# Patient Record
Sex: Male | Born: 1980 | Race: Black or African American | Hispanic: No | Marital: Single | Smoking: Current every day smoker
Health system: Southern US, Community
[De-identification: ages and names within clinical notes are randomized; demographics above are authoritative.]

## PROBLEM LIST (undated history)

## (undated) DIAGNOSIS — H579 Unspecified disorder of eye and adnexa: Secondary | ICD-10-CM

## (undated) HISTORY — PX: LEG AMPUTATION BELOW KNEE: SHX694

---

## 2005-07-18 ENCOUNTER — Inpatient Hospital Stay (HOSPITAL_COMMUNITY): Admission: AC | Admit: 2005-07-18 | Discharge: 2005-07-31 | Payer: Self-pay

## 2005-07-18 ENCOUNTER — Ambulatory Visit: Payer: Self-pay | Admitting: Internal Medicine

## 2005-07-27 ENCOUNTER — Ambulatory Visit: Payer: Self-pay | Admitting: Plastic Surgery

## 2005-09-02 ENCOUNTER — Ambulatory Visit: Payer: Self-pay | Admitting: Infectious Diseases

## 2005-09-02 ENCOUNTER — Inpatient Hospital Stay (HOSPITAL_COMMUNITY): Admission: AD | Admit: 2005-09-02 | Discharge: 2005-09-21 | Payer: Self-pay | Admitting: Specialist

## 2005-09-04 ENCOUNTER — Ambulatory Visit: Payer: Self-pay | Admitting: Plastic Surgery

## 2005-11-25 ENCOUNTER — Encounter: Admission: RE | Admit: 2005-11-25 | Discharge: 2005-11-25 | Payer: Self-pay | Admitting: Specialist

## 2006-03-29 ENCOUNTER — Emergency Department (HOSPITAL_COMMUNITY): Admission: EM | Admit: 2006-03-29 | Discharge: 2006-03-29 | Payer: Self-pay | Admitting: Emergency Medicine

## 2006-07-23 ENCOUNTER — Emergency Department (HOSPITAL_COMMUNITY): Admission: EM | Admit: 2006-07-23 | Discharge: 2006-07-23 | Payer: Self-pay | Admitting: Emergency Medicine

## 2006-08-28 ENCOUNTER — Ambulatory Visit: Payer: Self-pay | Admitting: Infectious Diseases

## 2006-08-28 ENCOUNTER — Inpatient Hospital Stay (HOSPITAL_COMMUNITY): Admission: RE | Admit: 2006-08-28 | Discharge: 2006-09-24 | Payer: Self-pay | Admitting: Specialist

## 2006-09-30 ENCOUNTER — Emergency Department (HOSPITAL_COMMUNITY): Admission: EM | Admit: 2006-09-30 | Discharge: 2006-09-30 | Payer: Self-pay | Admitting: Emergency Medicine

## 2007-05-25 ENCOUNTER — Emergency Department (HOSPITAL_COMMUNITY): Admission: EM | Admit: 2007-05-25 | Discharge: 2007-05-25 | Payer: Self-pay | Admitting: Emergency Medicine

## 2007-06-08 ENCOUNTER — Emergency Department (HOSPITAL_COMMUNITY): Admission: EM | Admit: 2007-06-08 | Discharge: 2007-06-08 | Payer: Self-pay | Admitting: Emergency Medicine

## 2007-08-28 ENCOUNTER — Emergency Department (HOSPITAL_COMMUNITY): Admission: EM | Admit: 2007-08-28 | Discharge: 2007-08-28 | Payer: Self-pay | Admitting: Emergency Medicine

## 2007-11-02 ENCOUNTER — Emergency Department (HOSPITAL_COMMUNITY): Admission: EM | Admit: 2007-11-02 | Discharge: 2007-11-02 | Payer: Self-pay | Admitting: Emergency Medicine

## 2008-01-05 ENCOUNTER — Emergency Department (HOSPITAL_COMMUNITY): Admission: EM | Admit: 2008-01-05 | Discharge: 2008-01-05 | Payer: Self-pay | Admitting: Emergency Medicine

## 2008-01-14 ENCOUNTER — Emergency Department (HOSPITAL_COMMUNITY): Admission: EM | Admit: 2008-01-14 | Discharge: 2008-01-14 | Payer: Self-pay | Admitting: Emergency Medicine

## 2008-03-04 ENCOUNTER — Emergency Department (HOSPITAL_COMMUNITY): Admission: EM | Admit: 2008-03-04 | Discharge: 2008-03-05 | Payer: Self-pay | Admitting: Emergency Medicine

## 2009-04-11 ENCOUNTER — Emergency Department (HOSPITAL_COMMUNITY): Admission: EM | Admit: 2009-04-11 | Discharge: 2009-04-11 | Payer: Self-pay | Admitting: Emergency Medicine

## 2009-05-21 ENCOUNTER — Emergency Department (HOSPITAL_COMMUNITY): Admission: EM | Admit: 2009-05-21 | Discharge: 2009-05-21 | Payer: Self-pay | Admitting: Emergency Medicine

## 2009-06-05 ENCOUNTER — Emergency Department (HOSPITAL_COMMUNITY): Admission: EM | Admit: 2009-06-05 | Discharge: 2009-06-05 | Payer: Self-pay | Admitting: Emergency Medicine

## 2009-06-11 ENCOUNTER — Emergency Department (HOSPITAL_COMMUNITY): Admission: EM | Admit: 2009-06-11 | Discharge: 2009-06-12 | Payer: Self-pay | Admitting: Emergency Medicine

## 2009-06-30 ENCOUNTER — Emergency Department (HOSPITAL_COMMUNITY): Admission: EM | Admit: 2009-06-30 | Discharge: 2009-06-30 | Payer: Self-pay | Admitting: Emergency Medicine

## 2010-11-16 ENCOUNTER — Encounter: Payer: Self-pay | Admitting: Specialist

## 2011-01-30 LAB — DIFFERENTIAL
Lymphocytes Relative: 10 % — ABNORMAL LOW (ref 12–46)
Lymphs Abs: 0.9 10*3/uL (ref 0.7–4.0)
Monocytes Relative: 6 % (ref 3–12)
Neutro Abs: 7.8 10*3/uL — ABNORMAL HIGH (ref 1.7–7.7)
Neutrophils Relative %: 82 % — ABNORMAL HIGH (ref 43–77)

## 2011-01-30 LAB — CBC
Platelets: 314 10*3/uL (ref 150–400)
RBC: 4.28 MIL/uL (ref 4.22–5.81)
WBC: 9.5 10*3/uL (ref 4.0–10.5)

## 2011-01-30 LAB — BASIC METABOLIC PANEL
BUN: 7 mg/dL (ref 6–23)
Creatinine, Ser: 0.87 mg/dL (ref 0.4–1.5)
GFR calc Af Amer: >60 mL/min (ref >60)
GFR calc non Af Amer: >60 mL/min (ref >60)

## 2011-01-30 LAB — CULTURE, ROUTINE-ABSCESS
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
## 2011-03-13 NOTE — Op Note (Signed)
NAME:  JERONIMO, HELLBERG NO.:  [id number]   MEDICAL RECORD NO.:  [id number]          PATIENT TYPE:  INP   LOCATION:  5738                         FACILITY:  [facility]   PHYSICIAN:  Kerrin Champagne, M.D.   DATE OF BIRTH:  1981/09/13   DATE OF PROCEDURE:  07/18/2005  DATE OF DISCHARGE:                                 OPERATIVE REPORT   ADDENDUM:  Please note that this patient at the end of the case prior to  placement of dressings did undergo measurement of compartment pressures. His  diastolic pressure at the time of compartment pressure measurements using  Stryker compartment measure was diastolic pressure of 66, measuring the  superficial posterior compartment measuring it about 17 to 20 mm.  His deep  posterior compartment at 23 mm, his anterior compartment at 32 mm.  His  lateral compartment at 33 mm.  These appeared to be well within acceptable  limits.  Prior to the surgery and placement of IM nails, his compartments  were completely supple.  In fact they did appear to have probably  decompressed with the fracture; however, after undergoing intramedullary  nail, there was some minimal swelling present.  However, the compartment  still remained quite supple.  Therefore compartment pressures were obtained  and it was felt that compartment release was not necessary based on these  compartment readings.  The patient will be observed postoperatively if he  develops further increasing pain discomfort unrelieved by medicines or  otherwise will re-examine his compartments to evaluate if there is any  worsening here.      Kerrin Champagne, M.D.  Electronically Signed     JEN/MEDQ  D:  07/18/2005  T:  07/20/2005  Job:  [id number]
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
## 2011-03-13 NOTE — Op Note (Signed)
[name] [name]              ACCOUNT NO.:  [id number]   MEDICAL RECORD NO.:  [id number]          PATIENT TYPE:  OUT   LOCATION:  RAD                           FACILITY:  [facility]   PHYSICIAN:  Kerrin Champagne, M.D.   DATE OF BIRTH:  11/26/1980   DATE OF PROCEDURE:  09/01/2006  DATE OF DISCHARGE:                               OPERATIVE REPORT   PREOPERATIVE DIAGNOSIS:  Infected, nonunion with osteomyelitis, left mid-  shaft tibia-fibular fracture.   POSTOPERATIVE DIAGNOSIS:  Infected, nonunion with osteomyelitis, left  mid-shaft tibia-fibular fracture.   PROCEDURE:  1. Repeat irrigation and debridement of left infected nonunion, mid-      shaft tibia fracture.  2. Removal of antibiotic tobramycin methylmethacrylate beads.  3. Reapplication of tobramycin methylmethacrylate beads.  4. Reapplication of Vac.   SURGEON:  Kerrin Champagne, M.D.   ASSISTANT:  None.   ANESTHESIA:  General via orotracheal intubation, Dr. Diamantina Monks.   SPECIMENS:  None.   ESTIMATED BLOOD LOSS:  50 mL.   COMPLICATIONS:  None.   TOTAL TOURNIQUET TIME:  At 350 mmHg, 53 minutes.   DISPOSITION:  The patient returned to the PACU in good condition.   HISTORY OF PRESENT ILLNESS:  This patient's history is well documented  within his chart.  Briefly, a 30 year old male sustained an open left  grade 3 tibia-fibular fracture treated initially with irrigation and  debridement, IM nailing. Closure was tight, requiring a Vac.  Patient  eventually went on to close the wound and then it reopened after several  weeks.  Returned to the operating room, had removal of nail, application  of external fixator, debridement of the area of open osteomyelitis.  With the application of fixator and use of Vac again, attempts were made  at trying to get this to heal.  The patient continued to have a draining  area over the anteromedial portion of the mid-shaft tibia.  Eventually  the external fixator was removed.  The  patient has developed a varus  deformity, radiographic signs of hypertrophic callus at the fracture  site and lucency.  Findings are consistent with an infected nonunion,  left mid-shaft tibia fracture.  He is brought back to the operating room  to undergo a repeat irrigation and debridement after undergoing an  initial irrigation and debridement on August 28, 2006.   FINDINGS INTRAOPERATIVELY:  The patient's antibiotic beads were removed  and areas of hematoma within the intramedullary canal, some small areas  requiring debridement of soft tissue as well as necrotic bone along the  posterior wall of the intramedullary cavity, as well as medially.  Otherwise several good areas of bleeding bone surface was noted.  No  wide areas of necrotic bone noted.   DESCRIPTION OF PROCEDURE:  After adequate general anesthesia, the  patient's left lower extremity was prepped with Betadine scrub and prep  solution following removal of previous Vac.  A tourniquet about the left  upper thigh.  Patient was draped in the usual manner.  The left lower  extremity was elevated and exsanguinated with elevation alone.  Tourniquet inflated to 350  mmHg.  The old incision was opened removing  Prolene sutures both distal and proximal.  The area was superficially  curetted and a Cobb elevator used to elevate more of the subcu layers  medially open.   Antibiotic beads were then removed from the intramedullary canal through  a defect over the anteromedial aspect of the mid-shaft tibia.  After  removal of these, then curet was used to debride old hematoma from  within the intramedullary canal.  There was no sign of infection or  purulence.  The entire channel within the intramedullary canal proximal  and distal was debrided using curettage.  Irrigation then with normal  saline solution.  A high speed bur was then used to bur the entire  circumference of the intramedullary canal, proximal to distal, down to   bleeding bone surfaces.  Irrigation was used during this process to  prevent any heat production.  The posterior wall of the infected  nonunion cavity showed an area of persistent non bleeding bone and this  was debrided using curet and bare rongeur in the posterior compartment  area.  This was carefully curetted to ensure there was no further  infected debris present along the medial wall of the fracture nonunion  site.  With this completed then, irrigation was further performed.  Using a pulse irrigant solution, nearly 3 L were passed through and then  double antibiotic solution was passed through the open wound site.   Following this then antibiotic beads were then made using a mixture of 1  g tobramycin to 1 packet of HV rapid forming polymethylmethacrylate  cement.  A 22-gauge wire carefully wrapped into small loops and small  balls of cement of polymethylmethacrylate combined with tobramycin were  then rounded over each of these loops in order to attach them to the  wires, six in total on one wire, two and three on the adjacent.  This  was then placed within the intramedullary chamber both proximal and  distal and then more superficial.  This completed then, a 2-0 Prolene  suture was used to approximate skin edges both proximal and distal with  a vertical mattress suture.  A Vac was then applied by placing small  portions of sponge within the central cavity that remained and then  overlying this area.  A small sponge was applied and this was affixed to  the skin using the Tegaderm-light material.  Suction portal was then  attached and attached to the Vac machine at 125 mmHg vacuum.   With this completed then, tourniquet was then released and the left  lower extremity returned to posterior splint and a 6-inch ace wrap used  to hold it in place.  Patient was then reactivated, extubated, returned  to the recovery room in satisfactory condition.  All instrument and sponge counts were  correct.      Kerrin Champagne, M.D.  Electronically Signed     JEN/MEDQ  D:  09/01/2006  T:  09/02/2006  Job:  [id number]
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
## 2011-03-13 NOTE — Discharge Summary (Signed)
NAMEJAYMASON, LEDESMA              ACCOUNT NO.:  [id number]   MEDICAL RECORD NO.:  [id number]          PATIENT TYPE:  INP   LOCATION:  5007                         FACILITY:  [facility]   PHYSICIAN:  Kerrin Champagne, M.D.   DATE OF BIRTH:  1981/08/15   DATE OF ADMISSION:  08/28/2006  DATE OF DISCHARGE:  09/24/2006                               DISCHARGE SUMMARY   ADDENDUM TO DISCHARGE SUMMARY   ADDITIONAL PROCEDURES:  Performed during this hospitalization September 22, 2006, repeat irrigation and debridement of left mid shaft tibial  infected nonunion with replacement of tobramycin beads and replacement  of vac.   CONSULTATIONS:  Obtained during this hospitalization:  1. Infectious disease, Dr. Burnice Logan.  2. Pharmacy consult regarding vancomycin treatment.  3. Radiology consult replacement of vac.   ADDENDUM TO HOSPITAL COURSE:  Mr. Arshdeep Bolger. Dibiasio continued with the  use of a vac. His vac was changed on September 20, 2006 on ward 5000 by  the wound care team. He was returned to the operating room on September 22, 2006 and underwent repeat irrigation and debridement of left  infected tibial nonunion with replacement of polymethylmethacrylate  beads and replacement of the vac. The patient remained on IV antibiotics  of vancomycin and ceftazidime, 1 g of each IV q.8h. This was given via  his PICC line. On September 23, 2006, the patient informed us that  through his family he was notified of his mother's recent passing as of  morning of September 23, 2006. This being the case, the patient was given  a short pass the evening September 23, 2006 in order to facilitate family  needs. He returned to the hospital the evening of September 23, 2006, and  on September 24, 2006, arrangements were made for him to continue with  the use of a vac on an outpatient basis until his presentation to [facility] on Sunday, September 26, 2006 for surgery  scheduled for Monday on September 27, 2006 with Dr. Lance Morin of  orthopedics/plastic surgery at [facility]. The  patient had prescriptions performed for vancomycin 1 g IV q.8h. and  ceftazidime 1 g IV q.8h. to continue through his PICC line through home  health nursing agencies in [place]. The patient will find his  way to [facility] with transportation from his home  on September 26, 2006. He was given a note regarding admission to Dr.  Edwin Cap service at [facility] with the  orthopedic/plastic surgery service. Pre-arranged surgery scheduled for  Monday morning. Mr. Baranowski does have his Medicaid card as of September 24, 2006. The patient will carry with him this discharge summary in  addition to update CT scans and radiographs since the time of his last  CT scan. If there are any questions regarding this patient, please  contact me. You can reach me through my pager at [phone] or through my  office at [phone].   DISCHARGE MEDICATIONS:  As above. The patient was given Lovenox  injectable syringes 40 mg to inject subcutaneously  q.a.m. at 8 a.m. for  Saturday and Sunday.   The status of the patient at the time of discharge is stable. Awaiting  further surgery at [facility].      Kerrin Champagne, M.D.  Electronically Signed     JEN/MEDQ  D:  09/24/2006  T:  09/24/2006  Job:  [id number]
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
## 2011-03-13 NOTE — Discharge Summary (Signed)
NAMEBRODRIC, SCHAUER NO.:  [id number]   MEDICAL RECORD NO.:  [id number]          PATIENT TYPE:  INP   LOCATION:  5007                         FACILITY:  [facility]   PHYSICIAN:  Kerrin Champagne, M.D.   DATE OF BIRTH:  1981-02-05   DATE OF ADMISSION:  08/28/2006  DATE OF DISCHARGE:  09/24/2006                               DISCHARGE SUMMARY   SECOND ADDENDUM:  His discharge summary just recently dictated an  addendum this morning.  [place] [id number] from early this  morning.   DISCHARGE PLANS:  The patient indicated that it has fallen to his  shoulders to help put together arrangements for his mother's funeral  within the next week, his mother having passed away yesterday morning,  September 23, 2006.  He notes that an older sister is overcome by the  grief of finding his mother passed away.  She requires assistance with  this.  His older brother is incarcerated and unable to assist.  This  being the case, we will discharge Mr. Hanley Hays home.  I called  [facility] and spoke with Rachael Darby in regard to canceling  Marcel's case scheduled for Monday, September 27, 2006, as an add on case.  Instead, [facility] and Dr. Edwin Cap office  will contact Hanley Hays at his home Monday morning on September 27, 2006, to make arrangements to see [facility] as an outpatient in their  facility and then make arrangements for further care in the form of flap  to his left leg.  In the interval, the patient will be discharged home  and be maintained on a VAC to the left leg to be changed 3 times weekly  by home health nursing.  He has indwelling antibiotic beads, #25, in the  left leg.  These will remain in place until he has final application of  flap for further procedures to his left leg.  He will continue in IV  antibiotics through home health nursing in the form of vancomycin 1 g IV  q.8h.  In addition to this, ceftazidime Elita Quick) 1  g IV q.8h.  For anti-  DVT prophylaxis, Lovenox 40 mg subcu q.a.m. at 8 a.m. he will receive  through their pharmacy at home health.  Further follow up with  vancomycin in regard to trough and peak levels for measurement during  his treatment will require at least 14 days of further IV treatment of  antibiotics with vancomycin and ceftazidime.  CBC and CMET should be  performed weekly.  Hopefully, this patient will complete his antibiotics  about the time he is scheduled to undergo vascularized flap at [facility].  This will be arranged with [facility] and the  patient.   The patient's status at the time of discharge is stable and improved.  The patient awaiting outpatient evaluation at [facility] for a  prevascularize of muscle flap to the left open to the fracture site.      Kerrin Champagne, M.D.  Electronically Signed     JEN/MEDQ  D:  09/24/2006  T:  09/24/2006  Job:  [id number]
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
## 2011-05-28 ENCOUNTER — Emergency Department (HOSPITAL_COMMUNITY)
Admission: EM | Admit: 2011-05-28 | Discharge: 2011-05-28 | Disposition: A | Discharge status: Home / Self Care | Payer: Self-pay | Attending: Emergency Medicine | Admitting: Emergency Medicine

## 2011-05-28 ENCOUNTER — Encounter: Payer: Self-pay | Admitting: *Deleted

## 2011-05-28 ENCOUNTER — Emergency Department (HOSPITAL_COMMUNITY): Payer: Self-pay

## 2011-05-28 DIAGNOSIS — F17200 Nicotine dependence, unspecified, uncomplicated: Secondary | ICD-10-CM | POA: Insufficient documentation

## 2011-05-28 DIAGNOSIS — S01501A Unspecified open wound of lip, initial encounter: Secondary | ICD-10-CM | POA: Insufficient documentation

## 2011-05-28 DIAGNOSIS — S0990XA Unspecified injury of head, initial encounter: Secondary | ICD-10-CM

## 2011-05-28 DIAGNOSIS — R55 Syncope and collapse: Secondary | ICD-10-CM | POA: Insufficient documentation

## 2011-05-28 DIAGNOSIS — S01511A Laceration without foreign body of lip, initial encounter: Secondary | ICD-10-CM

## 2011-05-28 DIAGNOSIS — S88119A Complete traumatic amputation at level between knee and ankle, unspecified lower leg, initial encounter: Secondary | ICD-10-CM | POA: Insufficient documentation

## 2011-05-28 MED ORDER — OXYCODONE-ACETAMINOPHEN 5-325 MG PO TABS
ORAL_TABLET | ORAL | Status: AC
  Administered 2011-05-28: 1
  Filled 2011-05-28: qty 1

## 2011-05-28 MED ORDER — LIDOCAINE HCL (PF) 1 % IJ SOLN
INTRAMUSCULAR | Status: AC
  Administered 2011-05-28: 5 mL
  Filled 2011-05-28: qty 5

## 2011-05-28 MED ORDER — CEPHALEXIN 500 MG PO CAPS: 500.0000 mg | ORAL_CAPSULE | Freq: Two times a day (BID) | ORAL | Status: AC
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
## 2011-05-28 NOTE — Discharge Instructions (Signed)
Clean laceration 2 times a day with soap and water.  Return as needed
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
## 2011-08-10 LAB — WOUND CULTURE
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
## 2012-08-29 ENCOUNTER — Encounter (HOSPITAL_COMMUNITY): Payer: Self-pay | Admitting: Emergency Medicine

## 2012-08-29 ENCOUNTER — Emergency Department (HOSPITAL_COMMUNITY)
Admission: EM | Admit: 2012-08-29 | Discharge: 2012-08-29 | Disposition: A | Discharge status: Home / Self Care | Payer: Medicaid Other | Attending: Emergency Medicine | Admitting: Emergency Medicine

## 2012-08-29 DIAGNOSIS — R5381 Other malaise: Secondary | ICD-10-CM | POA: Insufficient documentation

## 2012-08-29 DIAGNOSIS — S88119A Complete traumatic amputation at level between knee and ankle, unspecified lower leg, initial encounter: Secondary | ICD-10-CM | POA: Insufficient documentation

## 2012-08-29 DIAGNOSIS — R5383 Other fatigue: Secondary | ICD-10-CM

## 2012-08-29 DIAGNOSIS — F17200 Nicotine dependence, unspecified, uncomplicated: Secondary | ICD-10-CM | POA: Insufficient documentation

## 2012-08-29 LAB — CBC WITH DIFFERENTIAL/PLATELET
Basophils Absolute: 0.1 10*3/uL (ref 0.0–0.1)
Basophils Relative: 1 % (ref 0–1)
Eosinophils Absolute: 0.2 10*3/uL (ref 0.0–0.7)
Hemoglobin: 14.5 g/dL (ref 13.0–17.0)
MCH: 31.4 pg (ref 26.0–34.0)
MCHC: 33.9 g/dL (ref 30.0–36.0)
Monocytes Relative: 9 % (ref 3–12)
Neutrophils Relative %: 72 % (ref 43–77)
Platelets: 225 10*3/uL (ref 150–400)
RDW: 12.5 % (ref 11.5–15.5)

## 2012-08-29 LAB — BASIC METABOLIC PANEL
BUN: 9 mg/dL (ref 6–23)
Creatinine, Ser: 0.98 mg/dL (ref 0.50–1.35)
GFR calc Af Amer: >90 mL/min (ref >90)
GFR calc non Af Amer: >90 mL/min (ref >90)
Potassium: 4.1 mEq/L (ref 3.5–5.1)

## 2012-08-29 MED ORDER — BENZONATATE 100 MG PO CAPS: 100.0000 mg | ORAL_CAPSULE | Freq: Three times a day (TID) | ORAL | Status: DC

## 2012-08-29 MED ORDER — MUPIROCIN 2 % EX OINT: TOPICAL_OINTMENT | Freq: Three times a day (TID) | CUTANEOUS | Status: DC

## 2012-08-29 MED ORDER — SELENIUM SULFIDE 2.5 % EX LOTN: TOPICAL_LOTION | Freq: Every day | CUTANEOUS | Status: DC

## 2012-08-29 NOTE — ED Notes (Signed)
Pt states noticed wound on BKA about 2 weeks ago. Pt states drainage can sometimes be green in color. Pt states has had infections on amputation in past.

## 2012-08-29 NOTE — ED Notes (Signed)
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

## 2012-08-29 NOTE — ED Notes (Signed)
Report given Lyn, RN.

## 2012-08-29 NOTE — ED Notes (Signed)
Pt awaiting outpatient info from [facility] then will be able to d/c.
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
## 2012-08-29 NOTE — ED Notes (Signed)
ACT team at bedside.  

## 2012-10-07 ENCOUNTER — Encounter (HOSPITAL_COMMUNITY): Payer: Self-pay | Admitting: Emergency Medicine

## 2012-10-07 ENCOUNTER — Emergency Department (HOSPITAL_COMMUNITY)
Admission: EM | Admit: 2012-10-07 | Discharge: 2012-10-11 | Disposition: A | Discharge status: Home / Self Care | Payer: Medicaid Other | Source: Home / Self Care | Attending: Emergency Medicine | Admitting: Emergency Medicine

## 2012-10-07 DIAGNOSIS — Z5900 Homelessness unspecified: Secondary | ICD-10-CM

## 2012-10-07 DIAGNOSIS — F329 Major depressive disorder, single episode, unspecified: Secondary | ICD-10-CM | POA: Insufficient documentation

## 2012-10-07 DIAGNOSIS — F1210 Cannabis abuse, uncomplicated: Secondary | ICD-10-CM | POA: Diagnosis present

## 2012-10-07 DIAGNOSIS — F3289 Other specified depressive episodes: Secondary | ICD-10-CM | POA: Insufficient documentation

## 2012-10-07 DIAGNOSIS — F1010 Alcohol abuse, uncomplicated: Principal | ICD-10-CM | POA: Diagnosis present

## 2012-10-07 DIAGNOSIS — R45851 Suicidal ideations: Secondary | ICD-10-CM

## 2012-10-07 DIAGNOSIS — F17200 Nicotine dependence, unspecified, uncomplicated: Secondary | ICD-10-CM | POA: Diagnosis present

## 2012-10-07 DIAGNOSIS — S88119A Complete traumatic amputation at level between knee and ankle, unspecified lower leg, initial encounter: Secondary | ICD-10-CM | POA: Insufficient documentation

## 2012-10-07 DIAGNOSIS — F1994 Other psychoactive substance use, unspecified with psychoactive substance-induced mood disorder: Secondary | ICD-10-CM | POA: Diagnosis present

## 2012-10-07 LAB — CBC
MCH: 32 pg (ref 26.0–34.0)
MCHC: 35.2 g/dL (ref 30.0–36.0)
Platelets: 262 10*3/uL (ref 150–400)
RBC: 5 MIL/uL (ref 4.22–5.81)

## 2012-10-07 LAB — COMPREHENSIVE METABOLIC PANEL
ALT: 33 U/L (ref 0–53)
AST: 28 U/L (ref 0–37)
Calcium: 9.7 mg/dL (ref 8.4–10.5)
Sodium: 135 mEq/L (ref 135–145)
Total Protein: 7.8 g/dL (ref 6.0–8.3)

## 2012-10-07 LAB — SALICYLATE LEVEL: Salicylate Lvl: <2 mg/dL — ABNORMAL LOW (ref 2.8–20.0)

## 2012-10-07 LAB — ACETAMINOPHEN LEVEL: Acetaminophen (Tylenol), Serum: <15 ug/mL (ref 10–30)

## 2012-10-07 MED ORDER — NICOTINE 21 MG/24HR TD PT24
21.0000 mg | MEDICATED_PATCH | Freq: Every day | TRANSDERMAL | Status: DC
  Filled 2012-10-07 (×3): qty 1

## 2012-10-07 MED ORDER — IBUPROFEN 200 MG PO TABS
600.0000 mg | ORAL_TABLET | Freq: Three times a day (TID) | ORAL | Status: DC | PRN
  Administered 2012-10-10: 600 mg via ORAL
  Filled 2012-10-07: qty 3

## 2012-10-07 MED ORDER — ALUM & MAG HYDROXIDE-SIMETH 200-200-20 MG/5ML PO SUSP: 30.0000 mL | ORAL | Status: DC | PRN

## 2012-10-07 MED ORDER — LORAZEPAM 1 MG PO TABS: 1.0000 mg | ORAL_TABLET | Freq: Three times a day (TID) | ORAL | Status: DC | PRN

## 2012-10-07 MED ORDER — ONDANSETRON HCL 8 MG PO TABS: 4.0000 mg | ORAL_TABLET | Freq: Three times a day (TID) | ORAL | Status: DC | PRN
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
## 2012-10-07 NOTE — ED Notes (Signed)
Pt given tray, pt calm & cooperative

## 2012-10-07 NOTE — ED Provider Notes (Signed)
History     CSN: [id number]  Arrival date & time 10/07/12  2107   First MD Initiated Contact with Patient 10/07/12 2206      Chief Complaint  Patient presents with  . Suicidal    (Consider location/radiation/quality/duration/timing/severity/associated sxs/prior treatment) HPI History provided by pt.   Homeless 31yo M w/out psychiatric history presents w/ c/o intermittent SI.  Thoughts strong this evening and he was afraid he might actually act on them.  No plan.  Denies HI and drug abuse, but drinks alcohol on a daily basis.  Most recent drink 2 days ago.  Has not been experiencing withdrawal sx, nor has he ever.   History reviewed. No pertinent past medical history.  Past Surgical History  Procedure Date  . Leg amputation below knee     No family history on file.  History  Substance Use Topics  . Smoking status: Current Every Day Smoker -- 1.0 packs/day    Types: Cigarettes  . Smokeless tobacco: Not on file  . Alcohol Use: Yes      Review of Systems  All other systems reviewed and are negative.    Allergies  Review of patient's allergies indicates no known allergies.  Home Medications  No current outpatient prescriptions on file.  BP 135/82  Pulse 84  Temp 98.3 F (36.8 C) (Oral)  Resp 18  SpO2 97%  Physical Exam  Nursing note and vitals reviewed. Constitutional: He is oriented to person, place, and time. He appears well-developed and well-nourished. No distress.  HENT:  Head: Normocephalic and atraumatic.  Eyes:       Normal appearance  Neck: Normal range of motion.  Pulmonary/Chest: Effort normal.  Musculoskeletal: Normal range of motion.  Neurological: He is alert and oriented to person, place, and time.  Psychiatric: His behavior is normal.       depressed    ED Course  Procedures (including critical care time)  Labs Reviewed  CBC - Abnormal; Notable for the following:    WBC 12.8 (*)     All other components within normal limits   COMPREHENSIVE METABOLIC PANEL - Abnormal; Notable for the following:    Total Bilirubin 0.2 (*)     GFR calc non Af Amer 90 (*)     All other components within normal limits  SALICYLATE LEVEL - Abnormal; Notable for the following:    Salicylate Lvl <2.0 (*)     All other components within normal limits  ACETAMINOPHEN LEVEL  ETHANOL  URINE RAPID DRUG SCREEN (HOSP PERFORMED)   No results found.   1. Suicidal ideation       MDM  31yo healthy M w/out psychiatric history presents w/ SI.  Labs pending.  ACT team consulted.  Holding orders written and pt moved to Pod C.          Otilio Miu, PA-C 10/07/12 2348

## 2012-10-07 NOTE — ED Notes (Addendum)
Informed pt of plan of care.  Pt states he does have a razor in his pocket.  Security called to triage to get razor and to wand pt.  Pt changed into blue paper scrubs. Charge RN Barbara Cower) and [facility] Victorino Dike) notified. Razor placed in biohazard bag with pt label.  Informed pt how to get razor back upon discharge.  Pt states, "I probably don't need it back."

## 2012-10-08 LAB — RAPID URINE DRUG SCREEN, HOSP PERFORMED: Opiates: NOT DETECTED

## 2012-10-08 MED ORDER — ZOLPIDEM TARTRATE 5 MG PO TABS
10.0000 mg | ORAL_TABLET | Freq: Every day | ORAL | Status: DC
Start: 2012-10-08 — End: 2012-10-11
  Administered 2012-10-08 – 2012-10-10 (×3): 10 mg via ORAL
  Filled 2012-10-08 (×3): qty 2

## 2012-10-08 NOTE — ED Provider Notes (Addendum)
Medical screening examination/treatment/procedure(s) were performed by non-physician practitioner and as supervising physician I was immediately available for consultation/collaboration.   [name] Belmar B. Bernette Mayers, MD 10/08/12 0013  Pt declined at [facility] due to 'odor' in his prior BKA stump. The odor is from the sock he has used without washing for several months. There is no sign of infection. AC at [facility] is concerned about WBC 12. Reassured that this patient DOES NOT have a stump infection. There is no erythema, drainage or swelling. AC will reassess.   [name] Dolecki B. Bernette Mayers, MD 10/08/12 1827

## 2012-10-08 NOTE — BH Assessment (Signed)
Assessment Note   [name] is an 31 y.o. male.  Patient came to [facility] because he has been having suicidal thoughts that have been increasing over the last few days.  Patient says that he is homeless and also has little money to get by on.  He says, "What is the point of existing?"  When asked how he would do this he said "if I had a gun it would be instantaneous."  Patient says he could use a razor but that a gun would be quicker.  Patient denies any Hi or A/V hallucinations.  Patient is also using ETOH on an almost daily basis.  He consumes at least a pint daily when he has access to it.  His last drink was Wednesday, 12/11 and before then it had been a week since he had drank.  Patient also uses marijuana as often as he can get it.  Patient is anxious and has not slept but 4-5 hours in the last 2 days.  He has no contact with family, saying that he burnt bridges over the years.  He has a court day on 12/16 for trespassing, DUI and driving while license revoked.  Patient has also not been eating well.  At this time the patient would like to come in to [facility] for tx on a voluntary basis. Axis I: Depressive Disorder NOS Axis II: Deferred Axis III: History reviewed. No pertinent past medical history. Axis IV: economic problems, housing problems, occupational problems, problems related to legal system/crime and problems with primary support group Axis V: 31-40 impairment in reality testing  Past Medical History: History reviewed. No pertinent past medical history.  Past Surgical History  Procedure Date  . Leg amputation below knee     Family History: No family history on file.  Social History:  reports that he has been smoking Cigarettes.  He has been smoking about 1 pack per day. He does not have any smokeless tobacco history on file. He reports that he drinks alcohol. He reports that he uses illicit drugs (Marijuana).  Additional Social History:  Alcohol / Drug Use Pain Medications:  None Prescriptions: None Over the Counter: N/A History of alcohol / drug use?: Yes Longest period of sobriety (when/how long): Unknown Negative Consequences of Use: Legal;Personal relationships Substance #1 Name of Substance 1: ETOH 1 - Age of First Use: 31 years of age 89 - Amount (size/oz): At least a pint  1 - Frequency: Daily if he can get it daily.  Pt may go a few days between pints. 1 - Duration: On-going 1 - Last Use / Amount: Wednesday, 12/11 was the last time.  It was a week before then that he had had any. Substance #2 Name of Substance 2: Marijuana 2 - Age of First Use: 31 years of age 58 - Amount (size/oz): 2 joints per day  2 - Frequency: Daily if he can get it daily.  Usually it is 2-3 times per week 2 - Duration: On-going 2 - Last Use / Amount: Last week.  CIWA: CIWA-Ar BP: 117/67 mmHg Pulse Rate: 85  COWS:    Allergies: No Known Allergies  Home Medications:  (Not in a hospital admission)  OB/GYN Status:  No LMP for male patient.  General Assessment Data Location of Assessment: [facility] ED Living Arrangements: Other (Comment) (Pt is homeless) Can pt return to current living arrangement?: Yes Admission Status: Voluntary Is patient capable of signing voluntary admission?: Yes Transfer from: Acute Hospital Referral Source: Self/Family/Friend  Risk to self Suicidal Ideation: Yes-Currently Present Suicidal Intent: Yes-Currently Present Is patient at risk for suicide?: Yes Suicidal Plan?: Yes-Currently Present Specify Current Suicidal Plan:  (Had said that if he had a gun "it would be instantaneous.") Access to Means: No What has been your use of drugs/alcohol within the last 12 months?: ETOH and THC use Previous Attempts/Gestures: No How many times?: 0  Other Self Harm Risks: SA issues Triggers for Past Attempts: None known Intentional Self Injurious Behavior: None Family Suicide History: No Recent stressful life event(s): Financial Problems;Other  (Comment) (being homeless) Persecutory voices/beliefs?: No Depression: Yes Depression Symptoms: Despondent;Insomnia;Isolating;Fatigue;Guilt;Loss of interest in usual pleasures;Feeling worthless/self pity Substance abuse history and/or treatment for substance abuse?: No Suicide prevention information given to non-admitted patients: Not applicable  Risk to Others Homicidal Ideation: No Thoughts of Harm to Others: No Current Homicidal Intent: No Current Homicidal Plan: No Access to Homicidal Means: No Identified Victim: No one History of harm to others?: No Assessment of Violence: None Noted Violent Behavior Description: Pt calm and cooperative Does patient have access to weapons?: No Criminal Charges Pending?: Yes Describe Pending Criminal Charges: Trespassing, DUI, driving whilst license revoked Does patient have a court date: Yes Court Date: 10/10/12  Psychosis Hallucinations: None noted Delusions: None noted  Mental Status Report Appear/Hygiene: Disheveled Eye Contact: Fair Motor Activity: Freedom of movement;Unremarkable Speech: Logical/coherent Level of Consciousness: Quiet/awake Mood: Depressed;Anxious;Sad Affect: Depressed;Sad Anxiety Level: None Thought Processes: Relevant;Coherent Judgement: Unimpaired Orientation: Person;Place;Time;Situation Obsessive Compulsive Thoughts/Behaviors: None  Cognitive Functioning Concentration: Normal Memory: Recent Intact;Remote Intact IQ: Average Insight: Fair Impulse Control: Fair Appetite: Fair Weight Loss: 0  Weight Gain: 0  Sleep: Decreased Total Hours of Sleep:  (<4H/D) Vegetative Symptoms: None  ADLScreening [facility] Assessment Services) Patient's cognitive ability adequate to safely complete daily activities?: Yes Patient able to express need for assistance with ADLs?: Yes Independently performs ADLs?: Yes (appropriate for developmental age)  Abuse/Neglect [facility]) Physical Abuse: Denies Verbal Abuse: Denies Sexual  Abuse: Denies  Prior Inpatient Therapy Prior Inpatient Therapy: No Prior Therapy Dates: None Prior Therapy Facilty/Provider(s): N/A Reason for Treatment: N/A  Prior Outpatient Therapy Prior Outpatient Therapy: No Prior Therapy Dates: None Prior Therapy Facilty/Provider(s): None Reason for Treatment: None  ADL Screening (condition at time of admission) Patient's cognitive ability adequate to safely complete daily activities?: Yes Patient able to express need for assistance with ADLs?: Yes Independently performs ADLs?: Yes (appropriate for developmental age) Weakness of Legs: Left (Left prosthesis below knee.) Weakness of Arms/Hands: None  Home Assistive Devices/Equipment Home Assistive Devices/Equipment: None    Abuse/Neglect Assessment (Assessment to be complete while patient is alone) Physical Abuse: Denies Verbal Abuse: Denies Sexual Abuse: Denies Exploitation of patient/patient's resources: Denies Self-Neglect: Denies     Merchant navy officer (For Healthcare) Advance Directive: Patient does not have advance directive;Patient would not like information    Additional Information 1:1 In Past 12 Months?: No CIRT Risk: No Elopement Risk: No Does patient have medical clearance?: Yes     Disposition:  Disposition Disposition of Patient: Inpatient treatment program;Referred to Type of inpatient treatment program: Adult Patient referred to:  (Referred to [facility])  On Site Evaluation by:   Reviewed with Physician:     Alexandria Lodge 10/08/2012 6:24 AM

## 2012-10-08 NOTE — ED Notes (Signed)
Pt stated he does not have anywhere to live and needs placement.

## 2012-10-08 NOTE — ED Notes (Signed)
Pt has several dry, clean crevices at nub of left leg. EDP at bedside assessing condition of leg creases, no MRSA present, no draining, and no infection present. There is an odor that originates from rubber sock that attaches prosthetic to leg r/t years of constant sweat when ambulating; needs cleaning.

## 2012-10-08 NOTE — ED Provider Notes (Signed)
[name] is a 31 y.o. male who walked here to be evaluated for suicidal ideation. Thoughts are vague. He does not have a plan. He has had bouts previously, but never acted on them. He is homeless for one week after being told to leave the home he was staying in, with a friend. He does not see a therapist take medications or currently work. He has been assessed by ACT. He has not been seen by a Child psychotherapist , yet.  We'll have social work work with him regarding his homelessness.    Flint Melter, MD 10/08/12 1710

## 2012-10-08 NOTE — ED Notes (Signed)
Pt sleeping peacefully.

## 2012-10-09 NOTE — BH Assessment (Signed)
Assessment Note   [name] is an 31 y.o. male. This is reassessment of pt waiting bed for psych placement for SI.  Pt reports today that he has been homeless and sleeping at a bus stop.  Pt reports increasing depression of homelessness and his status as a disabled amputee.  Pt reports he began having suicidal thoughts on Friday, 12/13 due to his homelessness.  Pt reports today that he does not feel suicidal.  Pt admits to being somewhat depressed but states that he mainly needs help getting off the streets.  Pt also reports that his alcohol use has been worse lately and he would like to get in treatment to stop his drinking.  Pt states that he does not want to harm self at this point.  He is willing to go to the shelter but would like to locate bed a alcohol treatment facility if possible.  Pt has not had a drink since 12/11 and does not need detox.  PT denies HI/AV.    Axis I: Depressive Disorder NOS and Substance Abuse Axis II: Deferred Axis III: History reviewed. No pertinent past medical history. Axis IV: economic problems and housing problems Axis V: 51-60 moderate symptoms  Past Medical History: History reviewed. No pertinent past medical history.  Past Surgical History  Procedure Date  . Leg amputation below knee     Family History: No family history on file.  Social History:  reports that he has been smoking Cigarettes.  He has been smoking about 1 pack per day. He does not have any smokeless tobacco history on file. He reports that he drinks alcohol. He reports that he uses illicit drugs (Marijuana).  Additional Social History:  Alcohol / Drug Use Pain Medications: Pt denies Prescriptions: Pt denies Over the Counter: Pt denies History of alcohol / drug use?: Yes Longest period of sobriety (when/how long): Unknown Negative Consequences of Use: Legal;Personal relationships Substance #1 Name of Substance 1: alcohol 1 - Age of First Use: 16 1 - Amount (size/oz): 1 pint 1  - Frequency: 1x week recently.  Pt drinks more if he can afford more, lately he has not had money. 1 - Duration: 10+ years 1 - Last Use / Amount: 12/11,  1 pint Substance #2 Name of Substance 2: marijuana 2 - Age of First Use: 16 2 - Amount (size/oz): 2 joints 2 - Frequency: 2x week 2 - Duration: 10+ years 2 - Last Use / Amount: 10 days ago, 2 joints  CIWA: CIWA-Ar BP: 116/66 mmHg Pulse Rate: 63  COWS:    Allergies: No Known Allergies  Home Medications:  (Not in a hospital admission)  OB/GYN Status:  No LMP for male patient.  General Assessment Data Location of Assessment: [facility] ED ACT Assessment: Yes Living Arrangements: Other (Comment) (homeless) Can pt return to current living arrangement?: Yes Admission Status: Voluntary Is patient capable of signing voluntary admission?: Yes Transfer from: Acute Hospital Referral Source: Self/Family/Friend     Risk to self Suicidal Ideation: No-Not Currently/Within Last 6 Months Suicidal Intent: No Is patient at risk for suicide?: No Suicidal Plan?: No Specify Current Suicidal Plan:  (Had said that if he had a gun "it would be instantaneous.") Access to Means: No What has been your use of drugs/alcohol within the last 12 months?: current use Previous Attempts/Gestures: No How many times?: 0  Other Self Harm Risks: SA issues Triggers for Past Attempts: None known Intentional Self Injurious Behavior: None Family Suicide History: No Recent stressful life  event(s): Other (Comment);Financial Problems (homless, also amputee) Persecutory voices/beliefs?: No Depression: Yes Depression Symptoms: Despondent;Insomnia;Tearfulness;Isolating;Fatigue;Guilt;Feeling worthless/self pity;Feeling angry/irritable Substance abuse history and/or treatment for substance abuse?: Yes Suicide prevention information given to non-admitted patients: Not applicable  Risk to Others Homicidal Ideation: No Thoughts of Harm to Others: No Current Homicidal  Intent: No Current Homicidal Plan: No Access to Homicidal Means: No Identified Victim: No one History of harm to others?: No Assessment of Violence: None Noted Violent Behavior Description: Pt calm and cooperative Does patient have access to weapons?: No Criminal Charges Pending?: Yes Describe Pending Criminal Charges: DWI, trespassing Does patient have a court date: Yes Court Date: 10/10/12  Psychosis Hallucinations: None noted Delusions: None noted  Mental Status Report Appear/Hygiene: Other (Comment) (casual) Eye Contact: Good Motor Activity: Unremarkable Speech: Logical/coherent Level of Consciousness: Alert Mood: Other (Comment) (cooperative) Affect: Appropriate to circumstance Anxiety Level: None Thought Processes: Coherent;Relevant Judgement: Unimpaired Orientation: Person;Place;Time;Situation Obsessive Compulsive Thoughts/Behaviors: None  Cognitive Functioning Concentration: Normal Memory: Recent Intact;Remote Intact IQ: Average Insight: Good Impulse Control: Fair Appetite: Fair Weight Loss:  (yes, unsure how much) Weight Gain: 0  Sleep: Decreased Total Hours of Sleep: 4  Vegetative Symptoms: None  ADLScreening [facility] Assessment Services) Patient's cognitive ability adequate to safely complete daily activities?: Yes Patient able to express need for assistance with ADLs?: Yes Independently performs ADLs?: Yes (appropriate for developmental age)  Abuse/Neglect [facility]) Physical Abuse: Denies Verbal Abuse: Denies Sexual Abuse: Denies  Prior Inpatient Therapy Prior Inpatient Therapy: No Prior Therapy Dates: None Prior Therapy Facilty/Provider(s): N/A Reason for Treatment: N/A  Prior Outpatient Therapy Prior Outpatient Therapy: No Prior Therapy Dates: None Prior Therapy Facilty/Provider(s): None Reason for Treatment: None  ADL Screening (condition at time of admission) Patient's cognitive ability adequate to safely complete daily activities?:  Yes Patient able to express need for assistance with ADLs?: Yes Independently performs ADLs?: Yes (appropriate for developmental age) Weakness of Legs: Left (Left prosthesis below knee.) Weakness of Arms/Hands: None  Home Assistive Devices/Equipment Home Assistive Devices/Equipment: None    Abuse/Neglect Assessment (Assessment to be complete while patient is alone) Physical Abuse: Denies Verbal Abuse: Denies Sexual Abuse: Denies Exploitation of patient/patient's resources: Denies Self-Neglect: Denies     Merchant navy officer (For Healthcare) Advance Directive: Patient does not have advance directive;Patient would not like information    Additional Information 1:1 In Past 12 Months?: No CIRT Risk: No Elopement Risk: No Does patient have medical clearance?: Yes     Disposition:  Disposition Disposition of Patient: Inpatient treatment program;Referred to Type of inpatient treatment program: Adult Patient referred to:  (Referred to [facility])  On Site Evaluation by:   Reviewed with Physician:     Lorri Frederick 10/09/2012 11:07 PM

## 2012-10-09 NOTE — ED Notes (Signed)
BK-fast ordered 10/09/12

## 2012-10-10 NOTE — ED Notes (Signed)
Care transferred and report given to Alan, RN  

## 2012-10-10 NOTE — ED Notes (Signed)
Patient received meal tray.  Patient currently eating.

## 2012-10-11 ENCOUNTER — Inpatient Hospital Stay (HOSPITAL_COMMUNITY)
Admission: EM | Admit: 2012-10-11 | Discharge: 2012-10-18 | DRG: 897 | Disposition: A | Discharge status: Home / Self Care | Payer: Medicaid Other | Source: Ambulatory Visit | Attending: Psychiatry | Admitting: Psychiatry

## 2012-10-11 ENCOUNTER — Encounter (HOSPITAL_COMMUNITY): Payer: Self-pay | Admitting: *Deleted

## 2012-10-11 DIAGNOSIS — F1010 Alcohol abuse, uncomplicated: Secondary | ICD-10-CM

## 2012-10-11 DIAGNOSIS — Z590 Homelessness: Secondary | ICD-10-CM

## 2012-10-11 DIAGNOSIS — F329 Major depressive disorder, single episode, unspecified: Secondary | ICD-10-CM

## 2012-10-11 MED ORDER — ALUM & MAG HYDROXIDE-SIMETH 200-200-20 MG/5ML PO SUSP: 30.0000 mL | ORAL | Status: DC | PRN

## 2012-10-11 MED ORDER — TRAZODONE HCL 50 MG PO TABS
50.0000 mg | ORAL_TABLET | Freq: Every evening | ORAL | Status: DC | PRN
  Administered 2012-10-11: 50 mg via ORAL
  Filled 2012-10-11 (×7): qty 1

## 2012-10-11 MED ORDER — ACETAMINOPHEN 325 MG PO TABS: 650.0000 mg | ORAL_TABLET | Freq: Four times a day (QID) | ORAL | Status: DC | PRN

## 2012-10-11 MED ORDER — DULOXETINE HCL 30 MG PO CPEP
30.0000 mg | ORAL_CAPSULE | Freq: Every day | ORAL | Status: DC
  Filled 2012-10-11 (×2): qty 1

## 2012-10-11 MED ORDER — DULOXETINE HCL 30 MG PO CPEP
30.0000 mg | ORAL_CAPSULE | Freq: Every day | ORAL | Status: DC
Start: 2012-10-12 — End: 2012-10-14
  Administered 2012-10-13: 30 mg via ORAL
  Filled 2012-10-11 (×5): qty 1

## 2012-10-11 MED ORDER — MAGNESIUM HYDROXIDE 400 MG/5ML PO SUSP: 30.0000 mL | Freq: Every day | ORAL | Status: DC | PRN

## 2012-10-11 NOTE — BH Assessment (Signed)
[facility] Assessment Progress Note      Pt has been accepted to Dr. Daleen Bo for inpatient care.  All support paperwork completed and faxed to [facility].  Dr. Clarene Duke and nursing staff notified and agreeable with disposition.  Awaiting transfer.

## 2012-10-11 NOTE — ED Notes (Signed)
Pt belongings and sealed security signed bag sent with tech and security to [facility].

## 2012-10-11 NOTE — Tx Team (Signed)
Initial Interdisciplinary Treatment Plan  PATIENT STRENGTHS: (choose at least two) Ability for insight Average or above average intelligence Capable of independent living Communication skills  PATIENT STRESSORS: Financial difficulties Legal issue Substance abuse   PROBLEM LIST: Problem List/Patient Goals Date to be addressed Date deferred Reason deferred Estimated date of resolution  depression    discharge  Substance abuse    discharge                                             DISCHARGE CRITERIA:  Adequate post-discharge living arrangements Improved stabilization in mood, thinking, and/or behavior  PRELIMINARY DISCHARGE PLAN: Attend aftercare/continuing care group Attend 12-step recovery group  PATIENT/FAMIILY INVOLVEMENT: This treatment plan has been presented to and reviewed with the patient, [name].  The patient and family have been given the opportunity to ask questions and make suggestions.  Leighton Parody M 10/11/2012, 6:49 PM

## 2012-10-11 NOTE — Progress Notes (Signed)
Psychoeducational Group Note  Date:  10/11/2012 Time:8:00PM  Group Topic/Focus:  Wrap-Up Group:   The focus of this group is to help patients review their daily goal of treatment and discuss progress on daily workbooks.  Participation Level:  Active  Participation Quality:  Appropriate  Affect:  Appropriate  Cognitive:  Alert and Oriented  Insight:  Developing/Improving  Engagement in Group:  Developing/Improving  Additional Comments:  Pt rated his day as 10. Pt stated that he has a positive day. Pt verbalized his reason for admission was to improve his sobriety life.   [name], [name] 10/11/2012, 10:30 PM

## 2012-10-11 NOTE — ED Notes (Signed)
Report to [facility] at [facility] [address].
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
## 2012-10-11 NOTE — ED Notes (Signed)
Pt refuses Nicotine patch.  States that he does not need it.

## 2012-10-11 NOTE — Progress Notes (Signed)
Psychoeducational Group Note  Date:  10/11/2012 Time:  1100  Group Topic/Focus:  Wellness Toolbox:   The focus of this group is to discuss various aspects of wellness, balancing those aspects and exploring ways to increase the ability to experience wellness.  Patients will create a wellness toolbox for use upon discharge.  Participation Level:  Did Not Attend  Participation Quality:  Did not attend  Affect:  Did not attend  Cognitive:  Did not attend  Insight:  Did not attend  Engagement in Group:  Did not attend  Additional Comments:  Did not attend  Earline Mayotte 10/11/2012, 6:28 PM

## 2012-10-11 NOTE — ED Notes (Signed)
Pt ambulated to restroom. 

## 2012-10-11 NOTE — ED Provider Notes (Signed)
Pt accepted to [facility], will transfer stable.   Laray Anger, DO 10/11/12 1438

## 2012-10-11 NOTE — BH Assessment (Signed)
Assessment Note     Axis I: Depressive Disorder NOS Axis II: No diagnosis Axis III: History reviewed. No pertinent past medical history. Axis IV: economic problems, housing problems, occupational problems, other psychosocial or environmental problems, problems related to legal system/crime, problems related to social environment, problems with access to health care services and problems with primary support group Axis V: 41-50 serious symptoms   [name] is an 31 y.o. male. This is reassessment of pt waiting bed for psych placement for SI due to an inability to stop drinking.  Pt reports that he has been drinking since he was 31 years old.  Pt. reports increasing depression due to the having his leg amputated.  Pt also reports that his alcohol use has been worse lately and he would like to get in treatment to stop his drinking.  PT denies HI/AV.  Pt denies any withdrawal symptoms since he has been at the hospital since Friday.  Pt. frequently reports,  "What is the point of existing?". Patient reports using alcohol every day before coming to the hospital to get help. Patient also reports using marijuana as often as he can get it.    Past Medical History: History reviewed. No pertinent past medical history.  Past Surgical History  Procedure Date  . Leg amputation below knee     Family History: No family history on file.  Social History:  reports that he has been smoking Cigarettes.  He has been smoking about 1 pack per day. He does not have any smokeless tobacco history on file. He reports that he drinks alcohol. He reports that he uses illicit drugs (Marijuana).  Additional Social History:  Alcohol / Drug Use Pain Medications: Pt denies Prescriptions: Pt denies Over the Counter: Pt denies History of alcohol / drug use?: Yes Longest period of sobriety (when/how long): Unknown Negative Consequences of Use: Legal;Personal relationships Substance #1 Name of Substance 1: Alcohol   1 - Age of First Use: 16 1 - Amount (size/oz): 1 pint  1 - Frequency: daily 1 - Duration: Pt drinkas as much as he can afford 1 - Last Use / Amount: last friday  Substance #2 Name of Substance 2: Marijuanna  2 - Age of First Use: 15 2 - Amount (size/oz): varies 2 - Frequency: varies 2 - Duration: whenever he can  2 - Last Use / Amount: a couple of joints   CIWA: CIWA-Ar BP: 128/66 mmHg Pulse Rate: 62  COWS:    Allergies: No Known Allergies  Home Medications:  (Not in a hospital admission)  OB/GYN Status:  No LMP for male patient.  General Assessment Data Location of Assessment: [facility] ED ACT Assessment: Yes Living Arrangements: Other (Comment) Can pt return to current living arrangement?: Yes Admission Status: Voluntary Is patient capable of signing voluntary admission?: Yes Transfer from: Acute Hospital Referral Source: Self/Family/Friend  Education Status Is patient currently in school?: No  Risk to self Suicidal Ideation: Yes-Currently Present Suicidal Intent: Yes-Currently Present Is patient at risk for suicide?: Yes Suicidal Plan?: No Specify Current Suicidal Plan: drinking himself to death  Access to Means: No What has been your use of drugs/alcohol within the last 12 months?: Alcohol and marijuanna  Previous Attempts/Gestures: No How many times?: 0  Other Self Harm Risks: sa issues Triggers for Past Attempts: None known Intentional Self Injurious Behavior: None Family Suicide History: No Recent stressful life event(s): Other (Comment) Persecutory voices/beliefs?: No Depression: Yes Depression Symptoms: Despondent Substance abuse history and/or treatment for substance  abuse?: No Suicide prevention information given to non-admitted patients: Yes  Risk to Others Homicidal Ideation: No Thoughts of Harm to Others: No Current Homicidal Intent: No Current Homicidal Plan: No Access to Homicidal Means: No Identified Victim: None  History of harm to  others?: No Assessment of Violence: None Noted Violent Behavior Description: calm Does patient have access to weapons?: No Criminal Charges Pending?: Yes Describe Pending Criminal Charges: DWI and treaspassing  Does patient have a court date: Yes Court Date: 10/10/12  Psychosis Hallucinations: None noted Delusions: None noted  Mental Status Report Appear/Hygiene: Other (Comment) Eye Contact: Fair Motor Activity: Unremarkable Speech: Logical/coherent Level of Consciousness: Alert Mood: Other (Comment) Affect: Appropriate to circumstance Anxiety Level: None Thought Processes: Coherent Judgement: Unimpaired Orientation: Person;Place;Time;Situation Obsessive Compulsive Thoughts/Behaviors: None  Cognitive Functioning Concentration: Normal Memory: Recent Intact;Remote Intact IQ: Average Insight: Fair Impulse Control: Fair Appetite: Fair Weight Loss: 0  Weight Gain: 0  Sleep: Decreased Total Hours of Sleep: 5  Vegetative Symptoms: None  ADLScreening [facility] Assessment Services) Patient's cognitive ability adequate to safely complete daily activities?: Yes Patient able to express need for assistance with ADLs?: Yes Independently performs ADLs?: Yes (appropriate for developmental age)  Abuse/Neglect [facility]) Physical Abuse: Denies Verbal Abuse: Denies Sexual Abuse: Denies  Prior Inpatient Therapy Prior Inpatient Therapy: No Prior Therapy Dates: None  Prior Therapy Facilty/Provider(s): N/A Reason for Treatment: N/A  Prior Outpatient Therapy Prior Outpatient Therapy: No Prior Therapy Dates: na Prior Therapy Facilty/Provider(s): na Reason for Treatment: na  ADL Screening (condition at time of admission) Patient's cognitive ability adequate to safely complete daily activities?: Yes Patient able to express need for assistance with ADLs?: Yes Independently performs ADLs?: Yes (appropriate for developmental age) Weakness of Legs: Left (Left prosthesis below  knee.) Weakness of Arms/Hands: None  Home Assistive Devices/Equipment Home Assistive Devices/Equipment: None    Abuse/Neglect Assessment (Assessment to be complete while patient is alone) Physical Abuse: Denies Verbal Abuse: Denies Sexual Abuse: Denies Exploitation of patient/patient's resources: Denies Self-Neglect: Denies     [employer]chant [employer] officer (For Healthcare) Advance Directive: Patient does not have advance directive;Patient would not like information    Additional Information 1:1 In Past 12 Months?: No CIRT Risk: No Elopement Risk: No Does patient have medical clearance?: Yes     Disposition:  Disposition Disposition of Patient: Inpatient treatment program Type of inpatient treatment program: Adult Patient referred to: Other (Comment)  On Site Evaluation by:   Reviewed with Physician:     Phillip Heal LaVerne 10/11/2012 12:12 AM

## 2012-10-12 MED ORDER — HYDROXYZINE HCL 50 MG PO TABS
50.0000 mg | ORAL_TABLET | Freq: Every evening | ORAL | Status: DC | PRN
  Administered 2012-10-12 – 2012-10-17 (×10): 50 mg via ORAL
  Filled 2012-10-12 (×3): qty 1
  Filled 2012-10-12 (×3): qty 28
  Filled 2012-10-12 (×8): qty 1
  Filled 2012-10-12: qty 28
  Filled 2012-10-12 (×7): qty 1

## 2012-10-12 NOTE — Progress Notes (Signed)
D: Patient denies SI/HI and A/V hallucinations; patient reports sleep to be poor; reports appetite to be improving ; reports energy level is normal ; reports ability to pay attention is good; rates depression as 3/10; rates hopelessness 0/10; rates anxiety as 0/10;patient reports no withdrawal symptoms  A: Monitored q 15 minutes; patient encouraged to attend groups; patient educated about medications; patient given medications per physician orders; patient encouraged to express feelings and/or concerns  R: Patient is appropriate to situation and mood is appropriate; patient's interaction with staff and peers is appropriate ; patient refused his cymbalta; patient is attending all groups

## 2012-10-12 NOTE — [facility] Suicide Risk Assessment (Signed)
Suicide Risk Assessment  Admission Assessment     Nursing information obtained from:  Patient Demographic factors:  Male;Adolescent or young adult;Unemployed Current Mental Status:  Self-harm thoughts Loss Factors:  Legal issues;Financial problems / change in socioeconomic status Historical Factors:  Family history of mental illness or substance abuse Risk Reduction Factors:  Religious beliefs about death  CLINICAL FACTORS:   Alcohol/Substance Abuse/Dependencies  COGNITIVE FEATURES THAT CONTRIBUTE TO RISK:  Thought constriction (tunnel vision)    SUICIDE RISK:   Mild:  Suicidal ideation of limited frequency, intensity, duration, and specificity.  There are no identifiable plans, no associated intent, mild dysphoria and related symptoms, good self-control (both objective and subjective assessment), few other risk factors, and identifiable protective factors, including available and accessible social support.  PLAN OF CARE: Initiate Librium protocol. Encourage patient to attend groups.   Myra Weng 10/12/2012, 11:55 AM

## 2012-10-12 NOTE — H&P (Signed)
Psychiatric Admission Assessment Adult  Patient Identification:  [name] Date of Evaluation:  10/12/2012 Chief Complaint:  alcohol abuse Depressive Disorder History of Present Illness::[name] is a 31 y.o. male. Patient presented wit SI due to an inability to stop drinking. Pt reports that he has been drinking since he was 31 years old. Pt. reports increasing depression due to the having his leg amputated. Pt also reports that his alcohol use has been worse lately and he would like to get in treatment to stop his drinking. PT denies HI/AV. Pt denies any withdrawal symptoms since he has been at the hospital since Friday. Pt. frequently reports, "What is the point of existing?". Patient reports using alcohol every day before coming to the hospital to get help. Patient also reports using marijuana as often as he can get it.  Admits to drinking a pint of alcohol daily. Most recent DUI was in September of 2013.  Elements:  Location:  Adult [facility] unit. Quality:  drinking, depressed. Severity:  suicidal thoughts, withdrawal symptoms from alcohol abuse. Timing:  few weeks. Duration:  chronic since age 53. Context:  homeless, lack of job, leg amputated. Associated Signs/Synptoms: Depression Symptoms:  depressed mood, suicidal thoughts without plan, (Hypo) Manic Symptoms:  denies Anxiety Symptoms:  denies Psychotic Symptoms:  denies PTSD Symptoms: Negative  Psychiatric Specialty Exam: Physical Exam  ROS  Blood pressure 115/78, pulse 73, temperature 97.8 F (36.6 C), temperature source Oral, resp. rate 16, height 5' 10.75" (1.797 m), weight 79.833 kg (176 lb).Body mass index is 24.72 kg/(m^2).  General Appearance: Casual  Eye Contact::  Fair  Speech:  Clear and Coherent  Volume:  Normal  Mood:  Depressed  Affect:  Flat  Thought Process:  Coherent  Orientation:  Full (Time, Place, and Person)  Thought Content:  WDL  Suicidal Thoughts:  No  Homicidal Thoughts:  No  Memory:   Immediate;   Fair Recent;   Fair Remote;   Fair  Judgement:  Fair  Insight:  Fair  Psychomotor Activity:  Normal  Concentration:  Fair  Recall:  Fair  Akathisia:  No  Handed:  Right  AIMS (if indicated):     Assets:  Communication Skills Desire for Improvement  Sleep:  Number of Hours: 6.5     Past Psychiatric History: Diagnosis:  Hospitalizations:  Outpatient Care:  Substance Abuse Care:  Self-Mutilation:  Suicidal Attempts:  Violent Behaviors:   Past Medical History:  History reviewed. No pertinent past medical history. None. Allergies:  No Known Allergies PTA Medications: No prescriptions prior to admission    Previous Psychotropic Medications:  Medication/Dose                 Substance Abuse History in the last 12 months:  yes  Consequences of Substance Abuse: Withdrawal Symptoms:   Diaphoresis Headaches  Social History:  reports that he has been smoking Cigarettes.  He has been smoking about 1 pack per day. He does not have any smokeless tobacco history on file. He reports that he drinks alcohol. He reports that he uses illicit drugs (Marijuana). Additional Social History: Pain Medications: n/a Prescriptions: n/a Over the Counter: n/a History of alcohol / drug use?: Yes Longest period of sobriety (when/how long): unknown Negative Consequences of Use: Legal;Personal relationships Name of Substance 1: alcohol 1 - Age of First Use: 16 1 - Amount (size/oz): pint 1 - Frequency: daily 1 - Last Use / Amount: 09/27/12 Name of Substance 2: thc 2 - Age of First  Use: 15 2 - Amount (size/oz): varies 2 - Frequency: ocassional 2 - Duration: ocassionally                Current Place of Residence:   Place of Birth:   Family Members: Marital Status:  Single Children:  Sons:  Daughters: Relationships: Education:  [employer] Problems/Performance: Religious Beliefs/Practices: History of Abuse  (Emotional/Phsycial/Sexual) Occupational Experiences; Military History:  None. Legal History: Hobbies/Interests:  Family History:  History reviewed. No pertinent family history.  No results found for this or any previous visit (from the past 72 hour(s)). Psychological Evaluations:  Assessment:   AXIS I:  Alcohol Abuse and Depressive Disorder NOS AXIS II:  Deferred AXIS III:  History reviewed. No pertinent past medical history. AXIS IV:  housing problems, occupational problems and other psychosocial or environmental problems AXIS V:  51-60 moderate symptoms  Treatment Plan/Recommendations:  Initiate Librium protocol. Start other medications as appropriate.  Treatment Plan Summary: Daily contact with patient to assess and evaluate symptoms and progress in treatment Medication management Current Medications:  Current Facility-Administered Medications  Medication Dose Route Frequency Provider Last Rate Last Dose  . acetaminophen (TYLENOL) tablet 650 mg  650 mg Oral Q6H PRN Mickeal Skinner, MD      . alum & mag hydroxide-simeth (MAALOX/MYLANTA) 200-200-20 MG/5ML suspension 30 mL  30 mL Oral Q4H PRN Mickeal Skinner, MD      . DULoxetine (CYMBALTA) DR capsule 30 mg  30 mg Oral Daily Kymberlyn Eckford, MD      . magnesium hydroxide (MILK OF MAGNESIA) suspension 30 mL  30 mL Oral Daily PRN Mickeal Skinner, MD      . traZODone (DESYREL) tablet 50 mg  50 mg Oral QHS,MR X 1 Mickeal Skinner, MD   50 mg at 10/11/12 2157    Observation Level/Precautions:  15 minute checks  Laboratory:  Per admission ordetrs  Psychotherapy:    Medications:    Consultations:    Discharge Concerns:  Safety and stabilization  Estimated LOS:  Other:     I certify that inpatient services furnished can reasonably be expected to improve the patient's condition.   Raoul Ciano 12/18/201311:49 AM

## 2012-10-12 NOTE — Progress Notes (Signed)
[facility] LCSW Aftercare Discharge Planning Group Note    [name] 10/12/2012 11:18 AM    10/12/2012 11:18 AM  Participation Quality:  Appropriate  Affect:  Appropriate  Cognitive:  Appropriate  Insight:  Engaged  Engagement in Group:  Engaged  Modes of Intervention:  Exploration, Problem-solving and Support  Summary of Progress/Problems:  Patient advised of admitting to hospital due to alcohol abuse.  He endorses drinking a pint to a fifth of liquor daily.  He is considering residential treatment.  Patient shared he is currently homeless.  [name] 10/12/2012, 11:18 AM

## 2012-10-12 NOTE — Progress Notes (Signed)
Pt reports he was just admitted today for depression and detox from alcohol.  He says he is doing ok since his admission.  He denies any withdrawal symptoms at this time.  He denies SI/HI/AV.  Pt is homeless and not sure to where he will discharge.  He has some legal issues also with the court date coming up soon.  Pt was encouraged to make his needs known to staff.  Pt voices understanding.   Pt attended evening group tonight.  Support/encouragement given.  Safety maintained with q15 minute checks.

## 2012-10-12 NOTE — Progress Notes (Signed)
[facility] Group Notes:  (Counselor/Nursing/MHT/Case Management/Adjunct)  Type of Therapy:  Psychoeducational Skills  Participation Level:  Active  Participation Quality:  Appropriate and Attentive  Affect:  Appropriate  Cognitive:  Alert, Appropriate and Oriented  Insight:  Engaged  Engagement in Group:  Engaged  Engagement in Therapy:  n/a  Modes of Intervention:  Activity, Discussion, Education, Problem-solving, Rapport Building, Socialization and Support  Summary of Progress/Problems: Maximus attended psychoeducational group that focused on using quality time with support systems/individuals to engage in health coping skills. Rontavious participated in activity guessing about self and peers. Samir was quiet but attentive while group discussed who their support systems are, how they can spend positive quality time with them as a coping skills and a way to strengthen their relationship. Elim was given a homework assignment to find two ways to improve his support systems and twenty activities he can do to spend quality time with his supports.   Wandra Scot 10/12/2012 2:47 PM

## 2012-10-12 NOTE — Tx Team (Signed)
Interdisciplinary Treatment Plan Update (Adult)  Date:  10/12/2012  Time Reviewed:  9:52 AM   Progress in Treatment: Attending groups:   Yes   Participating in groups:  Yes Taking medication as prescribed:  Yes Tolerating medication:  Yes Family/Significant othe contact made: Contact to be made with family Patient understands diagnosis:  Yes Discussing patient identified problems/goals with staff: Yes Medical problems stabilized or resolved: Yes Denies suicidal/homicidal ideation:Yes Issues/concerns per patient self-inventory:  Other:   New problem(s) identified:  Reason for Continuation of Hospitalization: Anxiety Depression Medication stabilization  Interventions implemented related to continuation of hospitalization:  Medication Management; safety checks q 15 mins  Additional comments:  Estimated length of stay:  2-3 days  Discharge Plan:  Home with outpatient follow up  New goal(s):  Review of initial/current patient goals per problem list:    1.  Goal(s): Eliminate SI/other thoughts of self harm   Met:  Yes  Target date: d/c  As evidenced by: Patient no longer endorsing SI/HI or other thoughts of self harm.    2.  Goal (s):Reduce depression (rated at two)  Met: Yes  Target date: d/c  As evidenced by: Patient currently rating symptoms at four or below    3.  Goal(s):.stabilize on meds   Met:  No  Target date: d/c  As evidenced by: Patient will report stabilized on medications - less symptomatic    4.  Goal(s): Refer for outpatient follow up   Met:  No  Target date: d/c  As evidenced by: Follow up appointment scheduled    Attendees: Patient:   10/12/2012 9:52 AM  Physican:  Patrick North, MD 10/12/2012 9:52 AM  Nursing:  Neill Loft, RN 10/12/2012 9:52 AM   Nursing:   Quintella Reichert, RN 10/12/2012 9:52 AM   Clinical Social Worker:  Juline Patch, LCSW 10/12/2012 9:52 AM   Other: Tera Helper, PHM-NP 10/12/2012 9:52 AM    Other:  Patton Salles, Clinical Social Worker, LCSW  10/12/2012 9:52 AM Other:        10/12/2012 9:52 AM

## 2012-10-12 NOTE — Progress Notes (Addendum)
[facility] LCSW Group Therapy     Emotional Regulation 1:15 - 2:30 PM          10/12/2012 2:31 PM   Type of Therapy:  Group Therapy  Participation Level:  Active  Participation Quality:  Appropriate  Affect:  Appropriate  Cognitive:  Appropriate  Insight:  Engaged  Engagement in Therapy:  Engaged  Modes of Intervention:  Discussion, Exploration, Problem-solving and Support  Summary of Progress/Problems:  Patient advised he does a good job of keeping his emotions in control.  He shared it does bother him when people act differently towards him based on who they are aroound.  [name] 10/12/2012, 2:31 PM

## 2012-10-12 NOTE — Progress Notes (Signed)
[facility] LCSW Aftercare Discharge Planning Group Note       8:30-9:30 AM   10/12/2012 8:21 AM  Participation Quality:  Appropriate  Affect:  Appropriate  Cognitive:  Appropriate  Insight:  Engaged  Engagement in Group:  Engaged  Modes of Intervention:  Education, Exploration, Problem-solving and Support  Summary of Progress/Problems:  Patient advised of admitting to the hospital due to Alcohol abuse.  He denies SI/HI and rates depression at two.  Patient shared he is currently homeless and interested in residential treatment.  Majour Frei Hairston 10/12/2012, 8:21 AM

## 2012-10-13 DIAGNOSIS — F1010 Alcohol abuse, uncomplicated: Principal | ICD-10-CM

## 2012-10-13 DIAGNOSIS — F329 Major depressive disorder, single episode, unspecified: Secondary | ICD-10-CM | POA: Diagnosis present

## 2012-10-13 MED ORDER — CHLORDIAZEPOXIDE HCL 25 MG PO CAPS: 25.0000 mg | ORAL_CAPSULE | Freq: Three times a day (TID) | ORAL | Status: DC | PRN

## 2012-10-13 MED ORDER — MIRTAZAPINE 30 MG PO TABS
15.0000 mg | ORAL_TABLET | Freq: Every day | ORAL | Status: DC
  Administered 2012-10-13 – 2012-10-17 (×5): 15 mg via ORAL
  Filled 2012-10-13 (×6): qty 1
  Filled 2012-10-13: qty 7
  Filled 2012-10-13: qty 1
  Filled 2012-10-13: qty 7

## 2012-10-13 NOTE — Clinical Social Work Note (Signed)
Living a Balanced Life  1:15-2:30      [facility] LCSW Group Therapy            10/13/2012 3:57 PM    Type of Therapy:  Group Therapy  Participation Level:  Appropriate  Participation Quality:  Appropriate  Affect:  Appropriate  Cognitive:  Attentive Appropriate  Insight:  Engaged  Engagement in Therapy:  Engaged  Modes of Intervention:  Discussion Exploration Problem-Solving Supportive  Summary of Progress/Problems:  [name] listened attentively to [name] speaker.  He shared his strength is his "strength."  He shared she spends time exercising and stated group would be surprised to learn he is afraid of spider.  Patient wa late coming to group.  [name] 10/13/2012 3:57 PM

## 2012-10-13 NOTE — Progress Notes (Signed)
Psychoeducational Group Note  Date:  10/13/2012 Time:  1100  Group Topic/Focus:  Healthy Communication:   The focus of this group is to discuss communication, barriers to communication, as well as healthy ways to communicate with others.  Participation Level:  Active  Participation Quality:  Appropriate and Attentive  Affect:  Appropriate  Cognitive:  Alert and Appropriate  Insight:  Supportive  Engagement in Group:  Supportive  Additional Comments:    Noah Charon 10/13/2012, 12:12 PM

## 2012-10-13 NOTE — Progress Notes (Signed)
Psychoeducational Group Note  Date:  10/13/2012 Time:  1000  Group Topic/Focus:  Self Esteem Action Plan:   The focus of this group is to help patients create a plan to continue to build self-esteem after discharge.  Participation Level:  Minimal  Participation Quality:  Attentive  Affect:  Appropriate  Cognitive:  Appropriate  Insight:  Improving  Engagement in Group:  Limited  Additional Comments:  Patient attended group and spoke when asked to share, and did not complete workbook with patients on self-esteem action plan.   Karleen Hampshire Brittini 10/13/2012, 11:10 AM

## 2012-10-13 NOTE — Progress Notes (Signed)
D:  Patient up and in the milieu today.  Has attended and participated in groups and discussions.  Rates depression at a 3 and denies suicidal thoughts.  States sleep is poor and appetite is slowly improving.   A:  Medications given as ordered.  CIWA assessments performed per schedule.   R:  Pleasant and cooperative.  Tolerating medications.  No negative effects of alcohol withdrawal noted.  Interacting well with staff and peers.

## 2012-10-13 NOTE — Clinical Social Work Note (Signed)
[facility] LCSW Aftercare Discharge Planning Group Note        8:30-9:30 AM  10/13/2012 10:51 AM   Participation Quality:  Appropriate  Affect:  Appropriate  Cognitive:  Appropriate  Insight:  Engaged  Engagement in Group:  Engaged  Modes of Intervention:  Education, Exploration, Problem-solving and Support  Summary of Progress/Problems:  Patient reports doing well and denies SI/HI.  He rates depression at one.  Patient is hopeful to discharge to [facility] for residential treatment.  [name], LCSW 10/13/2012 10:51 AM

## 2012-10-13 NOTE — Progress Notes (Signed)
[facility] MD Progress Note  10/13/2012 10:21 AM [name]  MRN:  [id number] Subjective:  3/10 depression Diagnosis:  Axis I: Alcohol Abuse and Depressive Disorder NOS Axis II: Deferred Axis III: History reviewed. No pertinent past medical history. Axis IV: economic problems, housing problems, occupational problems, other psychosocial or environmental problems, problems related to social environment and problems with primary support group Axis V: 41-50 serious symptoms  ADL's:  Intact  Sleep: Fair  Appetite:  Good  Suicidal Ideation:  Denies Homicidal Ideation:  Denies  Psychiatric Specialty Exam: Review of Systems  Constitutional: Negative.   HENT: Negative.   Eyes: Negative.   Respiratory: Negative.   Cardiovascular: Negative.   Gastrointestinal: Negative.   Musculoskeletal: Negative.   Skin: Negative.   Neurological: Negative.   Endo/Heme/Allergies: Negative.   Psychiatric/Behavioral: Positive for depression and substance abuse.    Blood pressure 120/79, pulse 66, temperature 98 F (36.7 C), temperature source Oral, resp. rate 16, height 5' 10.75" (1.797 m), weight 79.833 kg (176 lb).Body mass index is 24.72 kg/(m^2).  General Appearance: Casual  Eye Contact::  Fair  Speech:  Normal Rate  Volume:  Normal  Mood:  Depressed  Affect:  Congruent  Thought Process:  Coherent  Orientation:  Full (Time, Place, and Person)  Thought Content:  WDL  Suicidal Thoughts:  No  Homicidal Thoughts:  No  Memory:  Immediate;   Fair Recent;   Fair Remote;   Fair  Judgement:  Fair  Insight:  Fair  Psychomotor Activity:  Decreased  Concentration:  Fair  Recall:  Fair  Akathisia:  No  Handed:  Right  AIMS (if indicated):     Assets:  Communication Skills Desire for Improvement Physical Health  Sleep:  Number of Hours: 6.5    Current Medications: Current Facility-Administered Medications  Medication Dose Route Frequency Provider Last Rate Last Dose  . acetaminophen  (TYLENOL) tablet 650 mg  650 mg Oral Q6H PRN Mickeal Skinner, MD      . alum & mag hydroxide-simeth (MAALOX/MYLANTA) 200-200-20 MG/5ML suspension 30 mL  30 mL Oral Q4H PRN Mickeal Skinner, MD      . DULoxetine (CYMBALTA) DR capsule 30 mg  30 mg Oral Daily Himabindu Ravi, MD   30 mg at 10/13/12 0807  . hydrOXYzine (ATARAX/VISTARIL) tablet 50 mg  50 mg Oral QHS,MR X 1 Kerry Hough, PA   50 mg at 10/12/12 2239  . magnesium hydroxide (MILK OF MAGNESIA) suspension 30 mL  30 mL Oral Daily PRN Mickeal Skinner, MD      . mirtazapine (REMERON) tablet 15 mg  15 mg Oral QHS Nanine Means, NP        Lab Results: No results found for this or any previous visit (from the past 48 hour(s)).  Physical Findings: AIMS: Facial and Oral Movements Muscles of Facial Expression: None, normal Lips and Perioral Area: None, normal Jaw: None, normal Tongue: None, normal,Extremity Movements Upper (arms, wrists, hands, fingers): None, normal Lower (legs, knees, ankles, toes): None, normal, Trunk Movements Neck, shoulders, hips: None, normal, Overall Severity Severity of abnormal movements (highest score from questions above): None, normal Incapacitation due to abnormal movements: None, normal Patient's awareness of abnormal movements (rate only patient's report): No Awareness, Dental Status Current problems with teeth and/or dentures?: No Does patient usually wear dentures?: No  CIWA:  CIWA-Ar Total: 1  COWS:     Treatment Plan Summary: Daily contact with patient to assess and evaluate symptoms and progress in treatment Medication management  Plan:  Review of chart, notes, vital signs, and medications. 1-Patient requested a sleep aide, trazodone did not work for him, Remeron 15 mg qhs started 2-Cymbalta continues for depression 3-Individual and group therapy 4-Coping skill progress for depression--exercise, socializing 5-Discharge plan developing to prevent relapse  Medical Decision Making Problem Points:   Established problem, stable/improving (1) and Review of psycho-social stressors (1) Data Points:  Review of medication regiment & side effects (2)  I certify that inpatient services furnished can reasonably be expected to improve the patient's condition.   Nanine Means, PMH-NP 10/13/2012, 10:21 AM

## 2012-10-13 NOTE — [facility] Counselor (Signed)
Adult Comprehensive Assessment  Patient ID: ASHLEE BEWLEY, male   DOB: January 30, 1981, 30 y.o.   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 Social relationships: None Substance abuse: Drinks one pint to a fiifth of liquor daily Bereavement / Loss: None  Living/Environment/Situation:  Living Arrangements: Alone Living conditions (as described by patient or guardian): Patient reports he has been homeless for the past month How long has patient lived in current situation?: one month What is atmosphere in current home: Other (Comment);Temporary (Patient is homeless)  Family History:  Marital status: Single Does patient have children?: No  Childhood History:  By whom was/is the patient raised?: Mother Additional childhood history information: Patient reports having a fair childholod Description of patient's relationship with caregiver when they were a child: Okay Patient's description of current relationship with people who raised him/her: Deceased Does patient have siblings?: Yes Number of Siblings: 2  Description of patient's current relationship with siblings: distant relationships Did patient suffer any verbal/emotional/physical/sexual abuse as a child?: No Did patient suffer from severe childhood neglect?: No Has patient ever been sexually abused/assaulted/raped as an adolescent or adult?: No Was the patient ever a victim of a crime or a disaster?: No Witnessed domestic violence?: No Has patient been effected by domestic violence as an adult?: No  Education:  Highest  grade of school patient has completed: 10th Currently a student?: Yes If yes, how has current illness impacted academic performance: None Name of school: Online with [employer] to complete GED requirements How long has the patient attended?: two months Learning disability?: No  Employment/Work Situation:   Employment situation: On disability 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 How long has patient been on disability: October 2013 Patient's job has been impacted by current illness: No What is the longest time patient has a held a job?: Year Where was the patient employed at that time?: [employer] Has patient ever been in the [employer]?: No Has patient ever served in Buyer, retail?: No  Financial Resources:   Surveyor, quantity resources: Writer Does patient have a Lawyer or guardian?: No  Alcohol/Substance Abuse:   What has been your use of drugs/alcohol within the last 12 months?: Alcohol  - drinks up to a fifth of liquor daily If attempted suicide, did drugs/alcohol play a role in this?: No Alcohol/Substance Abuse Treatment Hx: Denies past history Has alcohol/substance abuse ever caused legal problems?: Yes (Currently has a DUI charge)  Social Support System:   Forensic psychologist System: None Describe Community Support System: None Type of faith/religion: Christian How does patient's faith help to cope with current illness?: Attends church  Leisure/Recreation:   Leisure and Hobbies: Exercise  Strengths/Needs:   What things does the patient do well?: Mechanical In what areas does patient struggle / problems for patient: Alcohol  Discharge Plan:   Does patient have access to transportation?: No Plan for no access to transportation at discharge: will use public transportation Will patient be returning to same living situation after discharge?: Yes Currently receiving community mental health services: No If no, would patient like referral for services when  discharged?: Yes (What county?) [facility] [place] ) Does patient have financial  barriers related to discharge medications?: No  Summary/Recommendations:  Nelvin Tomb is a 31 years old African American male admitted with alcohol abuse and depressive disorder.  He will benefit from crisis stabilization, evaluation for medication, psycho-education groups for coping skills development, group therapy and case management for discharge planning.     Elven Laboy, Joesph July. 10/13/2012

## 2012-10-13 NOTE — Progress Notes (Signed)
[facility] INPATIENT:  Family/Significant Other Suicide Prevention Education  Suicide Prevention Education:  Patient Refusal for Family/Significant Other Suicide Prevention Education: The patient [name] has refused to provide written consent for family/significant other to be provided Family/Significant Other Suicide Prevention Education during admission and/or prior to discharge.  Physician notified.  Patient advised of no family involvement.  [name] 10/13/2012, 12:32 PM

## 2012-10-13 NOTE — Progress Notes (Signed)
Psychoeducational Group Note  Date:  10/13/2012 Time: 2015  Group Topic/Focus:  Wrap-Up Group:   The focus of this group is to help patients review their daily goal of treatment and discuss progress on daily workbooks.  Participation Level:  Active  Participation Quality:  Appropriate  Affect:  Appropriate  Cognitive:  Appropriate  Insight:  Engaged  Engagement in Group:  Engaged  Additional Comments:  Patient shared that he had a good day and had been at the hospital for as many days as he wanted to be.  Sherlin Sonier, Newton Pigg 10/13/2012, 12:12 AM

## 2012-10-14 DIAGNOSIS — F341 Dysthymic disorder: Secondary | ICD-10-CM

## 2012-10-14 MED ORDER — SERTRALINE HCL 50 MG PO TABS
50.0000 mg | ORAL_TABLET | Freq: Every day | ORAL | Status: DC
  Administered 2012-10-14 – 2012-10-18 (×5): 50 mg via ORAL
  Filled 2012-10-14 (×5): qty 1
  Filled 2012-10-14: qty 14
  Filled 2012-10-14 (×3): qty 1
  Filled 2012-10-14: qty 14

## 2012-10-14 NOTE — Progress Notes (Signed)
Psychoeducational Group Note  Date:  10/14/2012 Time:  1100  Group Topic/Focus:  Early Warning Signs:   The focus of this group is to help patients identify signs or symptoms they exhibit before slipping into an unhealthy state or crisis.  Participation Level:  Active  Participation Quality:  Appropriate, Sharing and Supportive  Affect:  Appropriate  Cognitive:  Appropriate  Insight:  Supportive  Engagement in Group:  Supportive  Additional Comments: none  Briasia Flinders E 10/14/2012, 1:48 PM
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
## 2012-10-14 NOTE — Progress Notes (Signed)
Psychoeducational Group Note  Date:  10/14/2012 Time:  2000  Group Topic/Focus:  Goals Group:   The focus of this group is to help patients establish daily goals to achieve during treatment and discuss how the patient can incorporate goal setting into their daily lives to aide in recovery.  Participation Level:  Minimal  Participation Quality:  Appropriate  Affect:  Anxious and Appropriate  Cognitive:  Appropriate  Insight:  Developing/Improving  Engagement in Group:  Developing/Improving  Additional Comments:  Patient stated that his day was excellent as "everyday is good for me." Patient's goal is to have peace of mind. Patient was able to list socializing with peers and playing board games as coping strategies that he has adopted since being admitted.   Rodman Pickle R 10/14/2012, 10:00 PM

## 2012-10-14 NOTE — Clinical Social Work Note (Signed)
[facility] LCSW Group Therapy  10/14/2012 6:48 PM  Type of Therapy:  Group Therapy  Participation Level:  Active  Participation Quality:  Appropriate, Attentive and Sharing  Affect:  Appropriate  Cognitive:  Alert and Oriented  Insight:  Good  Engagement in Therapy:  Engaged  Modes of Intervention:  Clarification, Exploration and Support  Summary of Progress/Problems:  Focus of group therapy session was to identify what balance would look like for individual patients after discharge.  Patients choose from group of photographs a photo that had meaning for them in reference to balance.  [name] chose a photo of a dollar bill and shared that when he is unbalanced and in crisis there is no money.  [name] was supportive of another patient sharing for the first time and said "communication is the key"  Clide Dales 10/14/2012, 6:19 PM

## 2012-10-14 NOTE — Progress Notes (Signed)
[facility] MD Progress Note  10/14/2012 10:40 AM [name]  MRN:  [id number] Subjective:  Patient reports being excessively sleepy since starting Cymbalta. Continues to endorse depressed mood, ruminating about loss of employment and his leg.  Diagnosis:  Axis I: Alcohol Abuse and Depressive Disorder NOS Axis II: Deferred Axis III: History reviewed. No pertinent past medical history. Axis IV: housing problems and occupational problems Axis V: 51-60 moderate symptoms  ADL's:  Intact  Sleep: Fair  Appetite:  Fair  Psychiatric Specialty Exam: Review of Systems  Constitutional: Negative.   HENT: Negative.   Eyes: Negative.   Respiratory: Negative.   Cardiovascular: Negative.   Gastrointestinal: Negative.   Genitourinary: Negative.   Skin: Negative.   Neurological: Negative.   Endo/Heme/Allergies: Negative.   Psychiatric/Behavioral: Positive for depression. The patient is nervous/anxious.     Blood pressure 124/76, pulse 79, temperature 97.8 F (36.6 C), temperature source Oral, resp. rate 16, height 5' 10.75" (1.797 m), weight 79.833 kg (176 lb).Body mass index is 24.72 kg/(m^2).  General Appearance: Casual  Eye Contact::  Fair  Speech:  Clear and Coherent  Volume:  Normal  Mood:  Depressed and Dysphoric  Affect:  Flat  Thought Process:  Coherent  Orientation:  Full (Time, Place, and Person)  Thought Content:  WDL  Suicidal Thoughts:  No  Homicidal Thoughts:  No  Memory:  Immediate;   Fair Recent;   Fair Remote;   Fair  Judgement:  Fair  Insight:  Fair  Psychomotor Activity:  Normal  Concentration:  Fair  Recall:  Fair  Akathisia:  No  Handed:  Right  AIMS (if indicated):     Assets:  Communication Skills Desire for Improvement  Sleep:  Number of Hours: 6.5    Current Medications: Current Facility-Administered Medications  Medication Dose Route Frequency Provider Last Rate Last Dose  . acetaminophen (TYLENOL) tablet 650 mg  650 mg Oral Q6H PRN Mickeal Skinner,  MD      . alum & mag hydroxide-simeth (MAALOX/MYLANTA) 200-200-20 MG/5ML suspension 30 mL  30 mL Oral Q4H PRN Mickeal Skinner, MD      . hydrOXYzine (ATARAX/VISTARIL) tablet 50 mg  50 mg Oral QHS,MR X 1 Kerry Hough, PA   50 mg at 10/13/12 2151  . magnesium hydroxide (MILK OF MAGNESIA) suspension 30 mL  30 mL Oral Daily PRN Mickeal Skinner, MD      . mirtazapine (REMERON) tablet 15 mg  15 mg Oral QHS Nanine Means, NP   15 mg at 10/13/12 2150    Lab Results: No results found for this or any previous visit (from the past 48 hour(s)).  Physical Findings: AIMS: Facial and Oral Movements Muscles of Facial Expression: None, normal Lips and Perioral Area: None, normal Jaw: None, normal Tongue: None, normal,Extremity Movements Upper (arms, wrists, hands, fingers): None, normal Lower (legs, knees, ankles, toes): None, normal, Trunk Movements Neck, shoulders, hips: None, normal, Overall Severity Severity of abnormal movements (highest score from questions above): None, normal Incapacitation due to abnormal movements: None, normal Patient's awareness of abnormal movements (rate only patient's report): No Awareness, Dental Status Current problems with teeth and/or dentures?: No Does patient usually wear dentures?: No  CIWA:  CIWA-Ar Total: 1  COWS:     Treatment Plan Summary: Daily contact with patient to assess and evaluate symptoms and progress in treatment Medication management  Plan: Discontinue Cymbalta. Start Zoloft at 25mg  po qd to target mood symptoms. Discussed with patient that this medication is more affordable and has  good efficacy. Encouraged to attend groups.  Medical Decision Making Problem Points:  Established problem, stable/improving (1), Review of last therapy session (1) and Review of psycho-social stressors (1) Data Points:  Review of medication regiment & side effects (2) Review of new medications or change in dosage (2)  I certify that inpatient services furnished  can reasonably be expected to improve the patient's condition.   Antony Sian 10/14/2012, 10:40 AM

## 2012-10-14 NOTE — Progress Notes (Signed)
Psychoeducational Group Note  Date:  10/14/2012 Time:  1000  Group Topic/Focus:  Wellness Toolbox:   The focus of this group is to discuss various aspects of wellness, balancing those aspects and exploring ways to increase the ability to experience wellness.  Patients will create a wellness toolbox for use upon discharge.  Participation Level:  Active  Participation Quality:  Appropriate, Sharing and Supportive  Affect:  Appropriate  Cognitive:  Appropriate  Insight:  Supportive  Engagement in Group:  Supportive  Additional Comments:  none  Jaslin Novitski M 10/14/2012, 2:02 PM  

## 2012-10-14 NOTE — Progress Notes (Addendum)
Psychoeducational Group Note  Date:  10/14/2012 Time:  2000  Group Topic/Focus:  Karaoke night   Participation Level:  Active  Participation Quality:  Appropriate  Affect:  Appropriate  Cognitive:  Appropriate  Insight:  Engaged  Engagement in Group:  Engaged  Additional Comments:   Pt attended karaoke group and participated.   Ulani Degrasse A 10/14/2012, 3:46 AM

## 2012-10-14 NOTE — Progress Notes (Signed)
D - Patient active on the unit, attended group karaoke tonight and actively participated. Patient appears happy, smiling and joking with peers. Patient interacting with peers appropriately. Patient denies SI/HI or hallucinations. Patient rates depression "3" and hopelessness "0."  A - Encouragement and support offered through therapeutic conversation. Medications given as ordered. Encouraged patient to speak with staff about any concerns or questions.  R - Patient safety maintained with Q 15 minute checks. Will continue to monitor patient.

## 2012-10-14 NOTE — Progress Notes (Signed)
[facility] LCSW Aftercare Discharge Planning Group Note  10/14/2012 12:59 PM  Participation Quality:  Did not attend    [name], [name] [place]rose[place] [name] 10/14/2012, 12:59 PM 

## 2012-10-15 NOTE — Clinical Social Work Note (Signed)
[facility] Group Notes:  (Clinical Social Work)  10/15/2012   3:00-4:00PM  Summary of Progress/Problems:   The main focus of today's process group was for the patient to identify ways in which they have in the past sabotaged their own recovery and reasons they may have done this/what they received from doing it.  We then worked to identify a specific plan to avoid doing this when discharged from the hospital for this admission.  The patient expressed that he sabotages himself by not even planning to remain sober, not even making a plan to stay away from his friends who drink.  This time, he states that he will not abandon the friendships, but he does plan to keep them long-distant.  He acknowledged that his thoughts that he can hang out with those friends is self-sabotage and identified countering statement to practice.  Type of Therapy:  Group Therapy - Process  Participation Level:  Active  Participation Quality:  Appropriate, Attentive, Sharing and Supportive  Affect:  Appropriate  Cognitive:  Alert, Appropriate and Oriented  Insight:  Engaged  Engagement in Therapy:  Engaged  Modes of Intervention:  Clarification, Education, Limit-setting, Problem-solving, Socialization, Support and Processing, Exploration, Discussion   Ambrose Mantle, LCSW 10/15/2012, 4:16 PM

## 2012-10-15 NOTE — Progress Notes (Signed)
[facility] Group Notes:  (Counselor/Nursing/MHT/Case Management/Adjunct)  10/15/2012 11:41 PM  Type of Therapy:  Psychoeducational Skills  Participation Level:  Active  Participation Quality:  Attentive and Monopolizing  Affect:  Excited  Cognitive:  Appropriate  Insight:  Developing/Improving  Engagement in Group:  Distracting  Engagement in Therapy:  Distracting  Modes of Intervention:  Education  Summary of Progress/Problems:The patient had to be redirected in group for excessive talking and for interrupting others. He states that he had a good day since he made it out doors to exercise. He also states that he was able to participate more in group today. His goal for tomorrow is to "take one day at a time".    Hazle Coca S 10/15/2012, 11:41 PM

## 2012-10-15 NOTE — Progress Notes (Signed)
[name] is seen out in the milieu. HE laughs, jokes, nurtures and  Deescalates other patients. He is engaged in his POC. HE demonstrates insight into  His illnes, is learning to process and understand his feelings and is learning to develop healthier coping skills.   A He completed his AM self inventory and on it he wrote he denies SI within the past 24 hrs, he rated his depression and  Hopelessness "3/0" and he stated his DC plan includes " better support myself".   R Safety is in place and POC cont.

## 2012-10-15 NOTE — Progress Notes (Signed)
Patient attended wrap up group this evening and participated. Patient reports that he is learning to take one day at a time and he has learned to cope by making sure he communicates. Patient reports playing board games and socializing with peers since being here has been helping him with coping. Patient denies si/hi/a/v hallucinations. Patient informed of scheduled medications and is agreeable to taking his medications later. Support and encouragement offered, safety maintained on unit, will continue to monitor with 15 min checks.

## 2012-10-15 NOTE — Progress Notes (Signed)
[facility] MD Progress Note  10/15/2012 3:16 PM [name]  MRN:  [id number] Subjective: Says depression is now 2-3 /10. Stayed asleep through the night last night. Feels the Zoloft is just right-not "heavy" like the Cymbalta.   Diagnosis:  Axis I: Alcohol Abuse and Depressive Disorder NOS Axis II: Deferred Axis III: History reviewed. No pertinent past medical history. Axis IV: housing problems and occupational problems Axis V: 51-60 moderate symptoms  ADL's:  Intact  Sleep: Fair  Appetite:  Fair  Psychiatric Specialty Exam: Review of Systems  Constitutional: Negative.   HENT: Negative.   Eyes: Negative.   Respiratory: Negative.   Cardiovascular: Negative.   Gastrointestinal: Negative.   Genitourinary: Negative.   Skin: Negative.   Neurological: Negative.   Endo/Heme/Allergies: Negative.   Psychiatric/Behavioral: Positive for depression. The patient is nervous/anxious.     Blood pressure 120/80, pulse 64, temperature 96.5 F (35.8 C), temperature source Oral, resp. rate 18, height 5' 10.75" (1.797 m), weight 79.833 kg (176 lb).Body mass index is 24.72 kg/(m^2).  General Appearance: Casual  Eye Contact::  Fair  Speech:  Clear and Coherent  Volume:  Normal  Mood:  Depressed and Dysphoric  Affect:  Flat  Thought Process:  Coherent  Orientation:  Full (Time, Place, and Person)  Thought Content:  WDL  Suicidal Thoughts:  No  Homicidal Thoughts:  No  Memory:  Immediate;   Fair Recent;   Fair Remote;   Fair  Judgement:  Fair  Insight:  Fair  Psychomotor Activity:  Normal  Concentration:  Fair  Recall:  Fair  Akathisia:  No  Handed:  Right  AIMS (if indicated):     Assets:  Communication Skills Desire for Improvement  Sleep:  Number of Hours: 6.5    Current Medications: Current Facility-Administered Medications  Medication Dose Route Frequency Provider Last Rate Last Dose  . acetaminophen (TYLENOL) tablet 650 mg  650 mg Oral Q6H PRN Mickeal Skinner, MD      .  alum & mag hydroxide-simeth (MAALOX/MYLANTA) 200-200-20 MG/5ML suspension 30 mL  30 mL Oral Q4H PRN Mickeal Skinner, MD      . hydrOXYzine (ATARAX/VISTARIL) tablet 50 mg  50 mg Oral QHS,MR X 1 Kerry Hough, PA   50 mg at 10/14/12 2241  . magnesium hydroxide (MILK OF MAGNESIA) suspension 30 mL  30 mL Oral Daily PRN Mickeal Skinner, MD      . mirtazapine (REMERON) tablet 15 mg  15 mg Oral QHS Nanine Means, NP   15 mg at 10/14/12 2131  . sertraline (ZOLOFT) tablet 50 mg  50 mg Oral Daily Nanine Means, NP   50 mg at 10/15/12 4098    Lab Results: No results found for this or any previous visit (from the past 48 hour(s)).  Physical Findings: AIMS: Facial and Oral Movements Muscles of Facial Expression: None, normal Lips and Perioral Area: None, normal Jaw: None, normal Tongue: None, normal,Extremity Movements Upper (arms, wrists, hands, fingers): None, normal Lower (legs, knees, ankles, toes): None, normal, Trunk Movements Neck, shoulders, hips: None, normal, Overall Severity Severity of abnormal movements (highest score from questions above): None, normal Incapacitation due to abnormal movements: None, normal Patient's awareness of abnormal movements (rate only patient's report): No Awareness, Dental Status Current problems with teeth and/or dentures?: No Does patient usually wear dentures?: No  CIWA:  CIWA-Ar Total: 0  COWS:     Treatment Plan Summary: Daily contact with patient to assess and evaluate symptoms and progress in treatment Medication management  Plan: Discontinue Cymbalta. Start Zoloft at 25mg  po qd to target mood symptoms. Discussed with patient that this medication is more affordable and has good efficacy. Encouraged to attend groups.  Medical Decision Making Problem Points:  Established problem, stable/improving (1), Review of last therapy session (1) and Review of psycho-social stressors (1) Data Points:  Review of medication regiment & side effects (2) Review of  new medications or change in dosage (2)  I certify that inpatient services furnished can reasonably be expected to improve the patient's condition.   Paris Chiriboga,MICKIE D. PA-C CAQ-Psych  10/15/2012, 3:16 PM

## 2012-10-15 NOTE — Progress Notes (Signed)
Group Topic/Focus:  Identifying Needs:   The focus of this group is to help patients identify their personal needs that have been historically problematic and identify healthy behaviors to address their needs.  Participation Level:  Active  Participation Quality:  Appropriate  Affect:  Appropriate  Cognitive:  Appropriate and Disorganized  Insight:  Engaged  Engagement in Group:  Engaged  Additional Comments:    [name] A   

## 2012-10-15 NOTE — Progress Notes (Signed)
Goals Group This is a group that identifies the program and helps them to start working on their packets for the day.  Each person sets a goal for the day that is measurable Pt attended the group and stated that his goal today would be to focus in on the positives in his life

## 2012-10-16 NOTE — Progress Notes (Signed)
The patient attended the A. A. Meeting on the 300 hallway. 

## 2012-10-16 NOTE — Progress Notes (Signed)
Patient has been up and active on the unit. Writer has observed him in the dayroom interacting appropriately with peers, playing cards at times. Patient attended group this evening and participated. Patient reports having had a good day and takes one day at a time because he does not plan for the future. Patient received praise during group from peers on how he handled a particular situation earlier today and used communication and healthy coping skills to handle the situation. Patient reports that plans to return to school. Patient denies si/hi/a/v hallucinations. Support and encouragement offered, safety maintained on unit, will continue to monitor.

## 2012-10-16 NOTE — Progress Notes (Signed)
Group Topic/Focus:  Making Healthy Choices:   The focus of this group is to help patients identify negative/unhealthy choices they were using prior to admission and identify positive/healthier coping strategies to replace them upon discharge.  Participation Level:  Active  Participation Quality:  Appropriate  Affect:  Appropriate  Cognitive:  Alert  Insight:  Engaged  Engagement in Group:  Engaged  Additional Comments:    [name] 10/16/2012   

## 2012-10-16 NOTE — Progress Notes (Signed)
Psychoeducational Group Note  Date:  10/16/2012 Time:  0115  Group Topic/Focus:  Identifying Needs:   The focus of this group is to help patients identify their personal needs that have been historically problematic and identify healthy behaviors to address their needs.  Participation Level:  Did Not Attend  Participation Quality:    Affect:    Cognitive:    Insight:    Engagement in Group:    Additional Comments:  Patient did not attend group.  Kalijah Zeiss, Newton Pigg 10/16/2012, 2:46 PM

## 2012-10-16 NOTE — Progress Notes (Signed)
[facility] MD Progress Note  10/16/2012 1:51 PM [name]  MRN:  [id number] Subjective:  Woke him from a asound sleep at almost 2pm. Says he slept allright last night. Doesn't have a firm place to discharge  to and doesn't seem to have anyone to be with for Christmas.Denies a need to change or adjust Zoloft 25 mg.   Diagnosis:  Axis I: Alcohol Abuse and Depressive Disorder NOS Axis II: Deferred Axis III: History reviewed. No pertinent past medical history. Axis IV: housing problems and occupational problems Axis V: 51-60 moderate symptoms  ADL's:  Intact  Sleep: Fair  Appetite:  Fair  Psychiatric Specialty Exam: Review of Systems  Constitutional: Negative.   HENT: Negative.   Eyes: Negative.   Respiratory: Negative.   Cardiovascular: Negative.   Gastrointestinal: Negative.   Genitourinary: Negative.   Skin: Negative.   Neurological: Negative.   Endo/Heme/Allergies: Negative.   Psychiatric/Behavioral: Positive for depression. The patient is nervous/anxious.     Blood pressure 120/80, pulse 64, temperature 96.5 F (35.8 C), temperature source Oral, resp. rate 18, height 5' 10.75" (1.797 m), weight 79.833 kg (176 lb).Body mass index is 24.72 kg/(m^2).  General Appearance: Casual  Eye Contact::  Fair  Speech:  Clear and Coherent  Volume:  Normal  Mood:  Depressed and Dysphoric  Affect:  Flat  Thought Process:  Coherent  Orientation:  Full (Time, Place, and Person)  Thought Content:  WDL  Suicidal Thoughts:  No  Homicidal Thoughts:  No  Memory:  Immediate;   Fair Recent;   Fair Remote;   Fair  Judgement:  Fair  Insight:  Fair  Psychomotor Activity:  Normal  Concentration:  Fair  Recall:  Fair  Akathisia:  No  Handed:  Right  AIMS (if indicated):     Assets:  Communication Skills Desire for Improvement  Sleep:  Number of Hours: 5.5    Current Medications: Current Facility-Administered Medications  Medication Dose Route Frequency Provider Last Rate Last Dose   . acetaminophen (TYLENOL) tablet 650 mg  650 mg Oral Q6H PRN Mickeal Skinner, MD      . alum & mag hydroxide-simeth (MAALOX/MYLANTA) 200-200-20 MG/5ML suspension 30 mL  30 mL Oral Q4H PRN Mickeal Skinner, MD      . hydrOXYzine (ATARAX/VISTARIL) tablet 50 mg  50 mg Oral QHS,MR X 1 Kerry Hough, PA   50 mg at 10/15/12 2238  . magnesium hydroxide (MILK OF MAGNESIA) suspension 30 mL  30 mL Oral Daily PRN Mickeal Skinner, MD      . mirtazapine (REMERON) tablet 15 mg  15 mg Oral QHS Nanine Means, NP   15 mg at 10/16/12 0044  . sertraline (ZOLOFT) tablet 50 mg  50 mg Oral Daily Nanine Means, NP   50 mg at 10/16/12 4098    Lab Results: No results found for this or any previous visit (from the past 48 hour(s)).  Physical Findings: AIMS: Facial and Oral Movements Muscles of Facial Expression: None, normal Lips and Perioral Area: None, normal Jaw: None, normal Tongue: None, normal,Extremity Movements Upper (arms, wrists, hands, fingers): None, normal Lower (legs, knees, ankles, toes): None, normal, Trunk Movements Neck, shoulders, hips: None, normal, Overall Severity Severity of abnormal movements (highest score from questions above): None, normal Incapacitation due to abnormal movements: None, normal Patient's awareness of abnormal movements (rate only patient's report): No Awareness, Dental Status Current problems with teeth and/or dentures?: No Does patient usually wear dentures?: No  CIWA:  CIWA-Ar Total: 0  COWS:  Treatment Plan Summary: Daily contact with patient to assess and evaluate symptoms and progress in treatment Medication management  Plan: Discontinue Cymbalta. Start Zoloft at 25mg  po qd to target mood symptoms. Discussed with patient that this medication is more affordable and has good efficacy. Encouraged to attend groups.  Medical Decision Making Problem Points:  Established problem, stable/improving (1), Review of last therapy session (1) and Review of psycho-social  stressors (1) Data Points:  Review of medication regiment & side effects (2) Review of new medications or change in dosage (2)  I certify that inpatient services furnished can reasonably be expected to improve the patient's condition.   Mikalia Fessel,MICKIE D. PA-C CAQ-Psych  10/16/2012, 1:51 PM

## 2012-10-16 NOTE — Clinical Social Work Note (Signed)
[facility] Group Notes:  (Clinical Social Work)  10/16/2012   3:00-4:00PM  Summary of Progress/Problems:   Summary of Progress/Problems:   The main focus of today's process group was for the patient to define "support" and describe what healthy supports are, then to identify the patient's current support system and decide on other supports that can be put in place to prevent future hospitalizations.  Roleplay was used to demonstrate definitions of different types of available supports.  An emphasis was placed on using therapist, doctor, therapy groups, self-help groups and problem-specific support groups to expand supports. Additionally, psychoeducation on various symptoms of mental illness was done at patients' requests.  The patient expressed understanding of topic and a willingness to pursue further supports, especially support groups that would be permanent to replace the very good supports he has felt from group of patients here at [facility].  Type of Therapy:  Process Group  Participation Level:  Active  Participation Quality:  Appropriate, Attentive and Sharing  Affect:  Blunted  Cognitive:  Alert, Appropriate and Oriented  Insight:  Engaged  Engagement in Therapy:  Engaged  Modes of Intervention:  Clarification, Education, Limit-setting, Problem-solving, Socialization, Support and Processing, Exploration, Discussion, Role-Play   Ambrose Mantle, LCSW 10/16/2012, 4:41 PM

## 2012-10-16 NOTE — Progress Notes (Signed)
[name] cont status quo. HE is engaged during his group discussion...showing insight into his maladaptive coping skills and also sharing with the group things he has learned ...that are health. HE is pleasant, he makes appropriate, logical conversation. He is observed offering support and positive feedback to his peers.   A He takes his medications as ordered. He completed his AM self inventory and on it he wrote he  Denied SI within the past 24 hrs, he rated his depression and hopelessness "0/0" and stated his DC plan was " fitness and health".   R POC cont.

## 2012-10-17 MED ORDER — HYDROXYZINE HCL 50 MG PO TABS: 50.0000 mg | ORAL_TABLET | Freq: Every evening | ORAL | Status: DC | PRN

## 2012-10-17 MED ORDER — MIRTAZAPINE 15 MG PO TABS: 15.0000 mg | ORAL_TABLET | Freq: Every day | ORAL | Status: DC

## 2012-10-17 MED ORDER — SERTRALINE HCL 50 MG PO TABS: 50.0000 mg | ORAL_TABLET | Freq: Every day | ORAL | Status: DC

## 2012-10-17 NOTE — Progress Notes (Signed)
Psychoeducational Group Note  Date:  10/17/2012 Time:  2000  Group Topic/Focus:  Wrap-Up Group:   The focus of this group is to help patients review their daily goal of treatment and discuss progress on daily workbooks.  Participation Level:  Active  Participation Quality:  Attentive and Sharing  Affect:  Appropriate  Cognitive:  Appropriate  Insight:  Distracting  Engagement in Group:  Distracting  Additional Comments:  Patient shared that he had a good day but other than that distracted other participants in the group.  Nyeisha Goodall, Newton Pigg 10/17/2012, 10:39 PM

## 2012-10-17 NOTE — Progress Notes (Signed)
Psychoeducational Group Note  Date:  10/17/2012 Time:  1100  Group Topic/Focus:  Self Care:   The focus of this group is to help patients understand the importance of self-care in order to improve or restore emotional, physical, spiritual, interpersonal, and financial health.  Participation Level:  Active  Participation Quality:  Appropriate and Attentive  Affect:  Appropriate  Cognitive:  Alert and Appropriate  Insight:  Engaged  Engagement in Group:  Engaged  Additional Comments:  Pt. Participated in self care assessment and shared his areas of improvement and goals.   Ruta Hinds Blende 10/17/2012, 3:07 PM

## 2012-10-17 NOTE — Progress Notes (Signed)
[facility] LCSW Aftercare Discharge Planning Group Note  10/17/2012 10:00 AM  Participation Quality:  Appropriate, Attentive and Redirectable  Affect:  Anxious, Blunted and Not Congruent  Cognitive:  Alert and Appropriate  Insight:  Limited  Engagement in Group:  Engaged  Modes of Intervention:  Discussion, Exploration and Problem-solving  Summary of Progress/Problems: Omran attended group and and received his morning packet. Reports he is doing well today, did not attend group on Friday, but reports he was sleeping and catching up on his rest. He is engaged today, reports when he leaves he will go to [employer] to get a new scarf to stay warm. When questioned about where he will stay he seems confused and reports he has no family here and could maybe stay with a friend, but does not seem concerned about his shelter at dc.  Patient was encouraged and motivated to speak with friend about discharge stay, because ultimately he will want to get his own place, but made known this is a holiday week and may be difficult to get a new apartment this week. Patient lacks insight to plan ahead.  Reports he is not depressed or anxious.  He is sleeping well and no problems with medications. Needs an outpatient referral in the community.  Will follow up.   Nail, Catalina Gravel 10/17/2012, 10:00 AM

## 2012-10-17 NOTE — Progress Notes (Signed)
[facility] LCSW Group Therapy  10/17/2012 4:29 PM  Type of Therapy:  Group Therapy  Participation Level:  Active  Participation Quality:  Attentive  Affect:  Appropriate  Cognitive:  Appropriate  Insight:  Engaged  Engagement in Therapy:  Engaged  Modes of Intervention:  Discussion, Exploration, Problem-solving and Support  Summary of Progress/Problems: The topic for group today was overcoming obstacles.  Pt discussed overcoming obstacles and what this means for pt. Patient discussed an obstacle for him is allowing the behaviors of other to control his emotions and feelings. Patient discussed plan to work through this obstacle by taking control and responsibility for his own emotions and feelings.  Patient was very supportive to peer and open to positive feedback.   Verna Czech [place] 10/17/2012, 4:29 PM

## 2012-10-17 NOTE — Progress Notes (Signed)
Patient has been up and active on the unit, attended AA group tonight in which he reports he enjoyed. Patient was compliant with medications and reports that his plans are to go to [facility]. Patient has been pleasant and appropriate on the unit, denies si/hi/a/v hallucinations. Support and encouragement offered, safety maintained on unit, will continue to monitor.

## 2012-10-17 NOTE — Progress Notes (Signed)
D:  [name] reports that he slept ok and that his appetite is improving.  He rates depression and hopelessness at 0/10.  He states that his mood is improving and he is interacting appropriately with other patients and staff.  He is going to groups and denies SI/HI/AVH. A:  Medications given as ordered.  Safety checks q 15 minutes.  Emotional support provided. R:  Safety maintained on unit.

## 2012-10-17 NOTE — Progress Notes (Signed)
[facility] MD Progress Note  10/17/2012 11:53 AM [name]  MRN:  [id number] Subjective:   Patient reports improved mood. Tolerating zoloft without side effects.   Diagnosis:  Axis I: Alcohol Abuse and Depressive Disorder NOS Axis II: Deferred Axis III: History reviewed. No pertinent past medical history. Axis IV: housing problems and occupational problems Axis V: 51-60 moderate symptoms  ADL's:  Intact  Sleep: Fair  Appetite:  Fair   Psychiatric Specialty Exam: Review of Systems  Constitutional: Negative.   HENT: Negative.   Eyes: Negative.   Respiratory: Negative.   Cardiovascular: Negative.   Gastrointestinal: Negative.   Genitourinary: Negative.   Musculoskeletal: Negative.   Skin: Negative.   Neurological: Negative.   Endo/Heme/Allergies: Negative.   Psychiatric/Behavioral: Positive for depression. The patient is nervous/anxious.     Blood pressure 134/79, pulse 70, temperature 97.5 F (36.4 C), temperature source Oral, resp. rate 16, height 5' 10.75" (1.797 m), weight 79.833 kg (176 lb).Body mass index is 24.72 kg/(m^2).  General Appearance: Casual  Eye Contact::  Fair  Speech:  Clear and Coherent  Volume:  Normal  Mood:  Anxious  Affect:  Flat  Thought Process:  Coherent  Orientation:  Full (Time, Place, and Person)  Thought Content:  WDL  Suicidal Thoughts:  No  Homicidal Thoughts:  No  Memory:  Immediate;   Fair Recent;   Fair Remote;   Fair  Judgement:  Fair  Insight:  Fair  Psychomotor Activity:  Normal  Concentration:  Fair  Recall:  Fair  Akathisia:  No  Handed:  Right  AIMS (if indicated):     Assets:  Communication Skills Desire for Improvement  Sleep:  Number of Hours: 6    Current Medications: Current Facility-Administered Medications  Medication Dose Route Frequency Provider Last Rate Last Dose  . acetaminophen (TYLENOL) tablet 650 mg  650 mg Oral Q6H PRN Mickeal Skinner, MD      . alum & mag hydroxide-simeth (MAALOX/MYLANTA)  200-200-20 MG/5ML suspension 30 mL  30 mL Oral Q4H PRN Mickeal Skinner, MD      . hydrOXYzine (ATARAX/VISTARIL) tablet 50 mg  50 mg Oral QHS,MR X 1 Kerry Hough, PA   50 mg at 10/16/12 2143  . magnesium hydroxide (MILK OF MAGNESIA) suspension 30 mL  30 mL Oral Daily PRN Mickeal Skinner, MD      . mirtazapine (REMERON) tablet 15 mg  15 mg Oral QHS Nanine Means, NP   15 mg at 10/16/12 2142  . sertraline (ZOLOFT) tablet 50 mg  50 mg Oral Daily Nanine Means, NP   50 mg at 10/17/12 4098    Lab Results: No results found for this or any previous visit (from the past 48 hour(s)).  Physical Findings: AIMS: Facial and Oral Movements Muscles of Facial Expression: None, normal Lips and Perioral Area: None, normal Jaw: None, normal Tongue: None, normal,Extremity Movements Upper (arms, wrists, hands, fingers): None, normal Lower (legs, knees, ankles, toes): None, normal, Trunk Movements Neck, shoulders, hips: None, normal, Overall Severity Severity of abnormal movements (highest score from questions above): None, normal Incapacitation due to abnormal movements: None, normal Patient's awareness of abnormal movements (rate only patient's report): No Awareness, Dental Status Current problems with teeth and/or dentures?: No Does patient usually wear dentures?: No  CIWA:  CIWA-Ar Total: 0  COWS:     Treatment Plan Summary: Daily contact with patient to assess and evaluate symptoms and progress in treatment Medication management  Plan: Continue current plan of care. Plan for discharge  tomorrow.  Medical Decision Making Problem Points:  Review of last therapy session (1) and Review of psycho-social stressors (1) Data Points:  Review of medication regiment & side effects (2)  I certify that inpatient services furnished can reasonably be expected to improve the patient's condition.   Jerney Baksh 10/17/2012, 11:53 AM

## 2012-10-18 DIAGNOSIS — F1910 Other psychoactive substance abuse, uncomplicated: Secondary | ICD-10-CM

## 2012-10-18 DIAGNOSIS — F1994 Other psychoactive substance use, unspecified with psychoactive substance-induced mood disorder: Secondary | ICD-10-CM

## 2012-10-18 NOTE — Progress Notes (Signed)
[facility] Adult Case Management Discharge Plan :  Will you be returning to the same living situation after discharge: No. Patient discharging to [facility] for residential treatment. At discharge, do you have transportation home?:Yes,  [facility] staff will transport patient to their facility Do you have the ability to pay for your medications:No.  Patient assisted with indigent medications.  Release of information consent forms completed and in the chart;  Patient's signature needed at discharge.  Patient to Follow up at: Follow-up Information    Follow up with [facility]. On 10/18/2012. ([facility] to transport patient for treatment today at 11:30 AM)    Contact information:   [address] [place], [place]  [address]  [phone]        Follow up with [facility].   Contact information:   [address] [place], [place]   [address]  [phone]         Patient denies SI/HI:   Yes,  Patient is no longer endorsing SI/HI or other thoughts of self harm    Safety Planning and Suicide Prevention discussed:  Yes,  Reviewed during aftercare groups  [name] 10/18/2012, 10:42 AM

## 2012-10-18 NOTE — Progress Notes (Signed)
D: Patient resting in bed with eyes closed.  Respirations even and unlabored.  Patient appears to be in no apparent distress. A: Staff to monitor Q 15 mins for safety.   R:Patient remains safe on the unit.  

## 2012-10-18 NOTE — [facility] Suicide Risk Assessment (Signed)
Suicide Risk Assessment  Discharge Assessment     Demographic Factors:  Male, Low socioeconomic status, Living alone and Unemployed  Mental Status Per Nursing Assessment::   On Admission:  Self-harm thoughts  Current Mental Status by Physician: Patient alert and oriented to 4. Patient denies AH/VH/SI/HI.  Loss Factors: Decrease in vocational status and Financial problems/change in socioeconomic status  Historical Factors: Impulsivity  Risk Reduction Factors:   Positive coping skills or problem solving skills  Continued Clinical Symptoms:  Depression:   Recent sense of peace/wellbeing  Cognitive Features That Contribute To Risk:  Cognitively intact    Suicide Risk:  Minimal: No identifiable suicidal ideation.  Patients presenting with no risk factors but with morbid ruminations; may be classified as minimal risk based on the severity of the depressive symptoms  Discharge Diagnoses:   AXIS I:  Alcohol Abuse and Depressive Disorder NOS AXIS II:  Deferred AXIS III:  History reviewed. No pertinent past medical history. AXIS IV:  economic problems, housing problems and occupational problems AXIS V:  61-70 mild symptoms  Plan Of Care/Follow-up recommendations:  Activity:  normal Diet:  normal  Is patient on multiple antipsychotic therapies at discharge:  No   Has Patient had three or more failed trials of antipsychotic monotherapy by history:  No  Recommended Plan for Multiple Antipsychotic Therapies: NA  Scotti Kosta 10/18/2012, 9:34 AM

## 2012-10-18 NOTE — Progress Notes (Signed)
D:  Patient discharged today.  Will be going to [facility] from here for further treatment.  All belongings retrieved from room and from locker.  Patient was given a two week supply of medications from the hospital pharmacy and prescriptions to fill medicines when those are gone.  Patient denies depressive symptoms or suicidal ideation.  States he feels ready for discharge.  A:  Reviewed all discharge instructions, medications, and follow up care.  Patient escorted to the search room to retrieve belongings then to the front lobby where he will wait for the [facility] representative to pick him up.  R:  Patient verbalized understanding of all discharge instructions.  States he feels he is ready to go to [facility].

## 2012-10-20 NOTE — Discharge Summary (Signed)
Physician Discharge Summary Note  Patient:  [name] is an 31 y.o., male MRN:  [id number] DOB:  11/17/1980 Patient phone:  [phone] (home)  Patient address:   [address]  [place] [address],   Date of Admission:  10/11/2012 Date of Discharge: 10/18/2012  Reason for Admission:  Alcohol dependency/detox, depression with suicidal ideations  Discharge Diagnoses: Active Problems:  Depression  Review of Systems  Constitutional: Negative.   HENT: Negative.   Eyes: Negative.   Respiratory: Negative.   Cardiovascular: Negative.   Gastrointestinal: Negative.   Genitourinary: Negative.   Musculoskeletal: Negative.   Skin: Negative.   Neurological: Negative.   Endo/Heme/Allergies: Negative.   Psychiatric/Behavioral: Positive for substance abuse.   Axis Diagnosis:   AXIS I:  Alcohol Abuse, Substance Abuse and Substance Induced Mood Disorder AXIS II:  Deferred AXIS III:  History reviewed. No pertinent past medical history. AXIS IV:  economic problems, other psychosocial or environmental problems, problems related to social environment and problems with primary support group AXIS V:  61-70 mild symptoms  Level of Care:  [facility]  Hospital Course:   1-Individual and group therapy 2-Medication management:  Zoloft for depression, atarax for anxiety, Remeron for sleep.  Alcohol detox medically managed successfully. 3-Coping skill development and implementation for depression and alcohol dependency 4-Psycho-education regarding relapse prevention and self-care 5-Discharge plan in place to prevent relapse 6-Patient denied suicidal/homicidal ideations and hallucinations, stable for discharge, Rx and 14 day supply of medications given at discharge, will continue his rehab at [facility] (inpatient recovery center)  Consults:  None  Significant Diagnostic Studies:  labs: Completed and reviewed, stable  Discharge Vitals:   Blood pressure 127/86, pulse 70, temperature 98 F (36.7  C), temperature source Oral, resp. rate 15, height 5' 10.75" (1.797 m), weight 79.833 kg (176 lb). Body mass index is 24.72 kg/(m^2). Lab Results:   No results found for this or any previous visit (from the past 72 hour(s)).  Physical Findings: AIMS: Facial and Oral Movements Muscles of Facial Expression: None, normal Lips and Perioral Area: None, normal Jaw: None, normal Tongue: None, normal,Extremity Movements Upper (arms, wrists, hands, fingers): None, normal Lower (legs, knees, ankles, toes): None, normal, Trunk Movements Neck, shoulders, hips: None, normal, Overall Severity Severity of abnormal movements (highest score from questions above): None, normal Incapacitation due to abnormal movements: None, normal Patient's awareness of abnormal movements (rate only patient's report): No Awareness, Dental Status Current problems with teeth and/or dentures?: No Does patient usually wear dentures?: No  CIWA:  CIWA-Ar Total: 0  COWS:     Psychiatric Specialty Exam: See Psychiatric Specialty Exam and Suicide Risk Assessment completed by Attending Physician prior to discharge.  Discharge destination:  [facility]  Is patient on multiple antipsychotic therapies at discharge:  No   Has Patient had three or more failed trials of antipsychotic monotherapy by history:  No Recommended Plan for Multiple Antipsychotic Therapies: N/A  Discharge Orders    Future Orders Please Complete By Expires   Diet - low sodium heart healthy      Activity as tolerated - No restrictions          Medication List     As of 10/20/2012 12:46 PM    TAKE these medications      Indication    hydrOXYzine 50 MG tablet   Commonly known as: ATARAX/VISTARIL   Take 1 tablet (50 mg total) by mouth at bedtime and may repeat dose one time if needed.    Indication: anxiety  mirtazapine 15 MG tablet   Commonly known as: REMERON   Take 1 tablet (15 mg total) by mouth at bedtime.    Indication: Trouble Sleeping        sertraline 50 MG tablet   Commonly known as: ZOLOFT   Take 1 tablet (50 mg total) by mouth daily.    Indication: Major Depressive Disorder           Follow-up Information    Follow up with [facility]. On 10/20/2012. ([facility] to transport patient for treatment today at 11:30 AM)    Contact information:   [address] [place], [place]  [address]  [phone]        Follow up with [facility].   Contact information:   [address] [place], [place]   [address]  [phone]         Follow-up recommendations:  Activity as tolerated, low-sodium heart healthy diet  Comments:  Patient discharged to [facility]  Total Discharge Time:  Greater than 30 minutes  Signed: Nanine Means, PMH-NP 10/20/2012, 12:46 PM

## 2012-10-21 NOTE — Discharge Summary (Signed)
Reviewed

## 2012-10-24 NOTE — Progress Notes (Signed)
Patient Discharge Instructions:  After Visit Summary (AVS):   Faxed to:  10/24/12 Psychiatric Admission Assessment Note:   Faxed to:  10/24/12 Suicide Risk Assessment - Discharge Assessment:   Faxed to:  10/24/12 Faxed/Sent to the Next Level Care provider:  10/24/12 Faxed to [facility] @ [phone] Faxed to [facility] @ [phone]  Jerelene Redden, 10/24/2012, 4:20 PM

## 2013-04-27 ENCOUNTER — Encounter (HOSPITAL_COMMUNITY): Payer: Self-pay | Admitting: Emergency Medicine

## 2013-04-27 ENCOUNTER — Emergency Department (HOSPITAL_COMMUNITY)
Admission: EM | Admit: 2013-04-27 | Discharge: 2013-04-27 | Disposition: A | Discharge status: Home / Self Care | Payer: Medicaid Other | Attending: Emergency Medicine | Admitting: Emergency Medicine

## 2013-04-27 DIAGNOSIS — S0180XA Unspecified open wound of other part of head, initial encounter: Secondary | ICD-10-CM | POA: Insufficient documentation

## 2013-04-27 DIAGNOSIS — Z79899 Other long term (current) drug therapy: Secondary | ICD-10-CM | POA: Insufficient documentation

## 2013-04-27 DIAGNOSIS — W06XXXA Fall from bed, initial encounter: Secondary | ICD-10-CM | POA: Insufficient documentation

## 2013-04-27 DIAGNOSIS — F17200 Nicotine dependence, unspecified, uncomplicated: Secondary | ICD-10-CM | POA: Insufficient documentation

## 2013-04-27 DIAGNOSIS — S01111A Laceration without foreign body of right eyelid and periocular area, initial encounter: Secondary | ICD-10-CM

## 2013-04-27 DIAGNOSIS — Y939 Activity, unspecified: Secondary | ICD-10-CM | POA: Insufficient documentation

## 2013-04-27 DIAGNOSIS — Y929 Unspecified place or not applicable: Secondary | ICD-10-CM | POA: Insufficient documentation

## 2013-04-27 MED ORDER — BACITRACIN-NEOMYCIN-POLYMYXIN 400-5-5000 EX OINT
TOPICAL_OINTMENT | CUTANEOUS | Status: AC
  Administered 2013-04-27: 08:00:00
  Filled 2013-04-27: qty 1

## 2013-04-27 MED ORDER — BACITRACIN ZINC 500 UNIT/GM EX OINT: TOPICAL_OINTMENT | Freq: Once | CUTANEOUS | Status: AC

## 2013-04-27 MED ORDER — LIDOCAINE-EPINEPHRINE (PF) 1 %-1:200000 IJ SOLN
10.0000 mL | Freq: Once | INTRAMUSCULAR | Status: AC
  Administered 2013-04-27: 08:00:00 via INTRADERMAL

## 2013-04-27 MED ORDER — LIDOCAINE-EPINEPHRINE (PF) 1 %-1:200000 IJ SOLN
INTRAMUSCULAR | Status: AC
  Filled 2013-04-27: qty 10

## 2013-04-27 NOTE — ED Provider Notes (Signed)
History  This chart was scribed for [name] B. Bernette Mayers, MD by Bennett Scrape, ED Scribe. This patient was seen in room APA08/APA08 and the patient's care was started at 7:35 AM.  CSN: [id number]  Arrival date & time 04/27/13  0726   First MD Initiated Contact with Patient 04/27/13 [phone]     Chief Complaint  Patient presents with  . Laceration    The history is provided by the patient. No language interpreter was used.    HPI Comments: EVERARDO VORIS is a 32 y.o. male who presents to the Emergency Department complaining of laceration to the right eyebrow from a fall out of bed this morning. He states that he was asleep at the time and rolled off the mattress hitting the corner of the nightstand. He reports that he applied antibacterial ointment to the area prior to arrival. Bleeding is controlled currently. He denies LOC and visual disturbances as associated symptoms. Pt does not have a h/o chronic medical conditions. Last TD was in 2008.  History reviewed. No pertinent past medical history. Past Surgical History  Procedure Laterality Date  . Leg amputation below knee     No family history on file. History  Substance Use Topics  . Smoking status: Current Every Day Smoker -- 1.00 packs/day    Types: Cigarettes  . Smokeless tobacco: Not on file  . Alcohol Use: Yes    Review of Systems  Eyes: Negative for visual disturbance.  Skin: Positive for wound.  Neurological: Negative for syncope.    Allergies  Review of patient's allergies indicates no known allergies.  Home Medications   Current Outpatient Rx  Name  Route  Sig  Dispense  Refill  . hydrOXYzine (ATARAX/VISTARIL) 50 MG tablet   Oral   Take 1 tablet (50 mg total) by mouth at bedtime and may repeat dose one time if needed.   30 tablet   0   . mirtazapine (REMERON) 15 MG tablet   Oral   Take 1 tablet (15 mg total) by mouth at bedtime.   30 tablet   0   . sertraline (ZOLOFT) 50 MG tablet   Oral   Take 1  tablet (50 mg total) by mouth daily.   30 tablet   0    Triage Vitals: BP 129/65  Pulse 67  Temp(Src) 97.2 F (36.2 C) (Oral)  Resp 16  Ht 6' (1.829 m)  Wt 190 lb (86.183 kg)  BMI 25.76 kg/m2  SpO2 100%  Physical Exam  Nursing note and vitals reviewed. Constitutional: He is oriented to person, place, and time. He appears well-developed and well-nourished.  HENT:  Head: Normocephalic.  2 cm laceration to the right lateral eyebrow  Eyes: EOM are normal.  Neck: Neck supple.  Cardiovascular: Normal rate.   Pulmonary/Chest: Effort normal.  Musculoskeletal: Normal range of motion.  Neurological: He is alert and oriented to person, place, and time. No cranial nerve deficit.  Psychiatric: He has a normal mood and affect. His behavior is normal.    ED Course  Procedures (including critical care time)  DIAGNOSTIC STUDIES: Oxygen Saturation is 100% on room air, normal by my interpretation.    COORDINATION OF CARE: 7:44 AM-Discussed treatment plan which includes laceration repair with pt at bedside and pt agreed to plan.   LACERATION REPAIR PROCEDURE NOTE The patient's identification was confirmed and consent was obtained. This procedure was performed by Leonette Most B. Bernette Mayers, MD at 7:53 AM. Site: right eyeborw Sterile procedures observed Anesthetic used (type  and amt): 2 local injections of 1 cc of 1% lidocaine with epi Suture type/size: 5-0 Ethilon  Length: 2 cm # of Sutures: 3 Technique: interrupted Complexity: simple Antibx ointment applied Tetanus UTD  Site anesthetized with betadine, explored without evidence of foreign body, wound well approximated, site covered with dry, sterile dressing.  Patient tolerated procedure well without complications. Instructions for care discussed verbally and patient provided with additional written instructions for homecare and f/u.   8:00 AM-Advised pt to follow up in 5 days to have stitches removed and to apply antibiotic ointment as  needed and pt agreed.  Labs Reviewed - No data to display No results found.  1. Laceration of right eyebrow, initial encounter     MDM  Superficial laceration repaired as above. Wound care instructions given.   I personally performed the services described in this documentation, which was scribed in my presence. The recorded information has been reviewed and is accurate.     [name] B. Bernette Mayers, MD 04/27/13 [phone]

## 2013-04-27 NOTE — ED Notes (Signed)
States that he rolled out of bed and hit his head on a night stand causing a laceration over his right eye.

## 2013-12-27 ENCOUNTER — Encounter (HOSPITAL_COMMUNITY): Payer: Self-pay | Admitting: Emergency Medicine

## 2013-12-27 ENCOUNTER — Emergency Department (HOSPITAL_COMMUNITY)
Admission: EM | Admit: 2013-12-27 | Discharge: 2013-12-27 | Disposition: A | Discharge status: Home / Self Care | Payer: Medicaid Other | Attending: Emergency Medicine | Admitting: Emergency Medicine

## 2013-12-27 DIAGNOSIS — Y929 Unspecified place or not applicable: Secondary | ICD-10-CM | POA: Insufficient documentation

## 2013-12-27 DIAGNOSIS — F17200 Nicotine dependence, unspecified, uncomplicated: Secondary | ICD-10-CM | POA: Insufficient documentation

## 2013-12-27 DIAGNOSIS — X58XXXA Exposure to other specified factors, initial encounter: Secondary | ICD-10-CM | POA: Insufficient documentation

## 2013-12-27 DIAGNOSIS — Y939 Activity, unspecified: Secondary | ICD-10-CM | POA: Insufficient documentation

## 2013-12-27 DIAGNOSIS — S0502XA Injury of conjunctiva and corneal abrasion without foreign body, left eye, initial encounter: Secondary | ICD-10-CM

## 2013-12-27 DIAGNOSIS — S058X9A Other injuries of unspecified eye and orbit, initial encounter: Secondary | ICD-10-CM | POA: Insufficient documentation

## 2013-12-27 MED ORDER — TOBRAMYCIN 0.3 % OP SOLN
1.0000 [drp] | Freq: Once | OPHTHALMIC | Status: AC
  Administered 2013-12-27: 1 [drp] via OPHTHALMIC
  Filled 2013-12-27: qty 5

## 2013-12-27 MED ORDER — FLUORESCEIN SODIUM 1 MG OP STRP
1.0000 | ORAL_STRIP | Freq: Once | OPHTHALMIC | Status: AC
  Administered 2013-12-27: 1 via OPHTHALMIC
  Filled 2013-12-27: qty 1

## 2013-12-27 MED ORDER — HYDROCODONE-ACETAMINOPHEN 5-325 MG PO TABS: ORAL_TABLET | ORAL | Status: DC

## 2013-12-27 MED ORDER — TETRACAINE HCL 0.5 % OP SOLN
2.0000 [drp] | Freq: Once | OPHTHALMIC | Status: AC
  Administered 2013-12-27: 2 [drp] via OPHTHALMIC
  Filled 2013-12-27: qty 2

## 2013-12-27 MED ORDER — KETOROLAC TROMETHAMINE 0.5 % OP SOLN
1.0000 [drp] | Freq: Once | OPHTHALMIC | Status: AC
  Administered 2013-12-27: 1 [drp] via OPHTHALMIC
  Filled 2013-12-27: qty 5

## 2013-12-27 NOTE — ED Notes (Signed)
Pt verbalized understanding of not driving within 4 hours of taking Vicodin due to med may cause drowsiness

## 2013-12-27 NOTE — ED Notes (Signed)
States he has something in his left eye, eye is red and watery

## 2013-12-27 NOTE — Discharge Instructions (Signed)
Corneal Abrasion  The cornea is the clear covering at the front and center of the eye. When looking at the colored portion of the eye (iris), you are looking through the cornea. This very thin tissue is made up of many layers. The surface layer is a single layer of cells (corneal epithelium) and is one of the most sensitive tissues in the body. If a scratch or injury causes the corneal epithelium to come off, it is called a corneal abrasion. If the injury extends to the tissues below the epithelium, the condition is called a corneal ulcer.  CAUSES    Scratches.   Trauma.   Foreign body in the eye.  Some people have recurrences of abrasions in the area of the original injury even after it has healed (recurrent erosion syndrome). Recurrent erosion syndrome generally improves and goes away with time.  SYMPTOMS    Eye pain.   Difficulty or inability to keep the injured eye open.   The eye becomes very sensitive to light.   Recurrent erosions tend to happen suddenly, first thing in the morning, usually after waking up and opening the eye.  DIAGNOSIS   Your health care provider can diagnose a corneal abrasion during an eye exam. Dye is usually placed in the eye using a drop or a small paper strip moistened by your tears. When the eye is examined with a special light, the abrasion shows up clearly because of the dye.  TREATMENT    Small abrasions may be treated with antibiotic drops or ointment alone.   Usually a pressure patch is specially applied. Pressure patches prevent the eye from blinking, allowing the corneal epithelium to heal. A pressure patch also reduces the amount of pain present in the eye during healing. Most corneal abrasions heal within 2 3 days with no effect on vision.  If the abrasion becomes infected and spreads to the deeper tissues of the cornea, a corneal ulcer can result. This is serious because it can cause corneal scarring. Corneal scars interfere with light passing through the cornea  and cause a loss of vision in the involved eye.  HOME CARE INSTRUCTIONS   Use medicine or ointment as directed. Only take over-the-counter or prescription medicines for pain, discomfort, or fever as directed by your health care provider.   Do not drive or operate machinery while your eye is patched. Your ability to judge distances is impaired.   If your health care provider has given you a follow-up appointment, it is very important to keep that appointment. Not keeping the appointment could result in a severe eye infection or permanent loss of vision. If there is any problem keeping the appointment, let your health care provider know.  SEEK MEDICAL CARE IF:    You have pain, light sensitivity, and a scratchy feeling in one eye or both eyes.   Your pressure patch keeps loosening up, and you can blink your eye under the patch after treatment.   Any kind of discharge develops from the eye after treatment or if the lids stick together in the morning.   You have the same symptoms in the morning as you did with the original abrasion days, weeks, or months after the abrasion healed.  MAKE SURE YOU:    Understand these instructions.   Will watch your condition.   Will get help right away if you are not doing well or get worse.  Document Released: 10/09/2000 Document Revised: 08/02/2013 Document Reviewed: 06/19/2013  [employer] Patient Information   2014 [employer]

## 2013-12-27 NOTE — ED Provider Notes (Signed)
CSN: [id number]     Arrival date & time 12/27/13  1717 History   First MD Initiated Contact with Patient 12/27/13 1811     Chief Complaint  Patient presents with  . Eye Pain     (Consider location/radiation/quality/duration/timing/severity/associated sxs/prior Treatment) Patient is a 33 y.o. male presenting with eye injury. The history is provided by the patient.  Eye Injury This is a new problem. Episode onset: 2 days ago. The problem occurs constantly. The problem has been unchanged. Associated symptoms include a visual change. Pertinent negatives include no chills, congestion, fever, headaches, nausea, neck pain, numbness, rash, sore throat, swollen glands, vertigo, vomiting or weakness. Exacerbated by: bright lights. He has tried nothing for the symptoms. The treatment provided no relief.   Patient c/o pain, redness and photo senisivity to the left eye for 2 days.  States that he "got something in my eye".  He states that he flushed his eye with water but pain has not improved.  He reports increased tearing of his eye and blurred vision.  He denies headache, visual loss, fever, chills, facial pain or contact use.   History reviewed. No pertinent past medical history. Past Surgical History  Procedure Laterality Date  . Leg amputation below knee     No family history on file. History  Substance Use Topics  . Smoking status: Current Every Day Smoker -- 1.00 packs/day    Types: Cigarettes  . Smokeless tobacco: Not on file  . Alcohol Use: Yes    Review of Systems  Constitutional: Negative for fever and chills.  HENT: Negative for congestion and sore throat.   Eyes: Positive for photophobia, pain, redness and visual disturbance. Negative for discharge and itching.  Gastrointestinal: Negative for nausea and vomiting.  Musculoskeletal: Negative for back pain and neck pain.  Skin: Negative for rash.  Neurological: Negative for dizziness, vertigo, weakness, numbness and headaches.   All other systems reviewed and are negative.      Allergies  Review of patient's allergies indicates no known allergies.  Home Medications  No current outpatient prescriptions on file. BP 149/90  Pulse 74  Temp(Src) 98.1 F (36.7 C) (Oral)  Resp 18  Ht 6' (1.829 m)  Wt 195 lb (88.451 kg)  BMI 26.44 kg/m2  SpO2 95% Physical Exam  Nursing note and vitals reviewed. Constitutional: He is oriented to person, place, and time. He appears well-developed and well-nourished. No distress.  HENT:  Head: Normocephalic and atraumatic.  Eyes: EOM are normal. Pupils are equal, round, and reactive to light. Lids are everted and swept, no foreign bodies found. Right conjunctiva is not injected. Left conjunctiva is not injected.  Large corneal abrasion to 6 o'clock position left eye. Slit lamp exam reveals negative Seidel's sign, no hyphema or FB , globe intact  Neck: Normal range of motion. Neck supple. No thyromegaly present.  Cardiovascular: Normal rate, regular rhythm, normal heart sounds and intact distal pulses.   No murmur heard. Pulmonary/Chest: Effort normal and breath sounds normal. No respiratory distress.  Musculoskeletal: Normal range of motion.  Lymphadenopathy:    He has no cervical adenopathy.  Neurological: He is alert and oriented to person, place, and time. He exhibits normal muscle tone. Coordination normal.  Skin: Skin is warm and dry. No rash noted.    ED Course  Procedures (including critical care time) Labs Review Labs Reviewed - No data to display Imaging Review No results found.   EKG Interpretation None      MDM  Final diagnoses:  Corneal abrasion, left    Patient has large corneal Abrasion to the left eye, 6 o'clock position.  No FB's.  No concerning sx's for penetrating truama  Eye was irrigated with saline.  Tobramycin and ketorolac drops dispensed.  Pt agrees to f/u with Dr. Lita MainsHaines tomorrow for recheck   The patient appears reasonably  screened and/or stabilized for discharge and I doubt any other medical condition or other [facility] requiring further screening, evaluation, or treatment in the ED at this time prior to discharge.     [name] Yoshimura L. Trisha Mangleriplett, PA-C 12/29/13 1739

## 2014-01-01 NOTE — ED Provider Notes (Signed)
Medical screening examination/treatment/procedure(s) were performed by non-physician practitioner and as supervising physician I was immediately available for consultation/collaboration.   EKG Interpretation None     ]   Reinhart Saulters L Esta Carmon, MD 01/01/14 0715 

## 2014-01-18 ENCOUNTER — Ambulatory Visit: Payer: Self-pay | Admitting: Family Medicine

## 2014-02-01 ENCOUNTER — Encounter (HOSPITAL_COMMUNITY): Payer: Self-pay

## 2014-02-01 ENCOUNTER — Inpatient Hospital Stay (HOSPITAL_COMMUNITY)
Admission: AD | Admit: 2014-02-01 | Discharge: 2014-02-08 | DRG: 897 | Disposition: A | Discharge status: Home / Self Care | Payer: MEDICAID | Source: Intra-hospital | Attending: Psychiatry | Admitting: Psychiatry

## 2014-02-01 ENCOUNTER — Emergency Department (HOSPITAL_COMMUNITY)
Admission: EM | Admit: 2014-02-01 | Discharge: 2014-02-01 | Disposition: A | Discharge status: Home / Self Care | Payer: MEDICAID | Attending: Emergency Medicine | Admitting: Emergency Medicine

## 2014-02-01 ENCOUNTER — Encounter (HOSPITAL_COMMUNITY): Payer: Self-pay | Admitting: Emergency Medicine

## 2014-02-01 DIAGNOSIS — F17200 Nicotine dependence, unspecified, uncomplicated: Secondary | ICD-10-CM | POA: Diagnosis present

## 2014-02-01 DIAGNOSIS — F32A Depression, unspecified: Secondary | ICD-10-CM

## 2014-02-01 DIAGNOSIS — F1210 Cannabis abuse, uncomplicated: Secondary | ICD-10-CM | POA: Insufficient documentation

## 2014-02-01 DIAGNOSIS — Z8669 Personal history of other diseases of the nervous system and sense organs: Secondary | ICD-10-CM | POA: Insufficient documentation

## 2014-02-01 DIAGNOSIS — F1020 Alcohol dependence, uncomplicated: Secondary | ICD-10-CM | POA: Diagnosis present

## 2014-02-01 DIAGNOSIS — F1010 Alcohol abuse, uncomplicated: Secondary | ICD-10-CM | POA: Insufficient documentation

## 2014-02-01 DIAGNOSIS — H579 Unspecified disorder of eye and adnexa: Secondary | ICD-10-CM

## 2014-02-01 DIAGNOSIS — R4789 Other speech disturbances: Secondary | ICD-10-CM | POA: Insufficient documentation

## 2014-02-01 DIAGNOSIS — F329 Major depressive disorder, single episode, unspecified: Secondary | ICD-10-CM | POA: Insufficient documentation

## 2014-02-01 DIAGNOSIS — R45851 Suicidal ideations: Secondary | ICD-10-CM

## 2014-02-01 DIAGNOSIS — F1994 Other psychoactive substance use, unspecified with psychoactive substance-induced mood disorder: Secondary | ICD-10-CM

## 2014-02-01 DIAGNOSIS — F3289 Other specified depressive episodes: Secondary | ICD-10-CM | POA: Insufficient documentation

## 2014-02-01 DIAGNOSIS — F1220 Cannabis dependence, uncomplicated: Secondary | ICD-10-CM | POA: Diagnosis present

## 2014-02-01 DIAGNOSIS — F10229 Alcohol dependence with intoxication, unspecified: Principal | ICD-10-CM | POA: Diagnosis present

## 2014-02-01 HISTORY — DX: Unspecified disorder of eye and adnexa: H57.9

## 2014-02-01 LAB — CBC WITH DIFFERENTIAL/PLATELET
BASOS ABS: 0.1 10*3/uL (ref 0.0–0.1)
BASOS PCT: 1 % (ref 0–1)
EOS PCT: 2 % (ref 0–5)
Eosinophils Absolute: 0.1 10*3/uL (ref 0.0–0.7)
HEMATOCRIT: 40.7 % (ref 39.0–52.0)
HEMOGLOBIN: 13.8 g/dL (ref 13.0–17.0)
Lymphocytes Relative: 30 % (ref 12–46)
Lymphs Abs: 2.1 10*3/uL (ref 0.7–4.0)
MCH: 31.2 pg (ref 26.0–34.0)
MCHC: 33.9 g/dL (ref 30.0–36.0)
MCV: 91.9 fL (ref 78.0–100.0)
MONO ABS: 0.5 10*3/uL (ref 0.1–1.0)
MONOS PCT: 7 % (ref 3–12)
Neutro Abs: 4.4 10*3/uL (ref 1.7–7.7)
Neutrophils Relative %: 61 % (ref 43–77)
Platelets: 206 10*3/uL (ref 150–400)
RBC: 4.43 MIL/uL (ref 4.22–5.81)
RDW: 11.7 % (ref 11.5–15.5)
WBC: 7.2 10*3/uL (ref 4.0–10.5)

## 2014-02-01 LAB — BASIC METABOLIC PANEL
BUN: 16 mg/dL (ref 6–23)
CALCIUM: 9 mg/dL (ref 8.4–10.5)
CO2: 22 meq/L (ref 19–32)
CREATININE: 1.11 mg/dL (ref 0.50–1.35)
Chloride: 102 mEq/L (ref 96–112)
GFR calc non Af Amer: 87 mL/min — ABNORMAL LOW (ref >90)
Glucose, Bld: 96 mg/dL (ref 70–99)
Potassium: 4.1 mEq/L (ref 3.7–5.3)
Sodium: 139 mEq/L (ref 137–147)

## 2014-02-01 LAB — RAPID URINE DRUG SCREEN, HOSP PERFORMED
AMPHETAMINES: NOT DETECTED
BARBITURATES: NOT DETECTED
BENZODIAZEPINES: NOT DETECTED
Cocaine: NOT DETECTED
Opiates: NOT DETECTED
Tetrahydrocannabinol: POSITIVE — AB

## 2014-02-01 LAB — ETHANOL: ALCOHOL ETHYL (B): 174 mg/dL — AB (ref 0–11)

## 2014-02-01 MED ORDER — CHLORDIAZEPOXIDE HCL 25 MG PO CAPS
25.0000 mg | ORAL_CAPSULE | Freq: Four times a day (QID) | ORAL | Status: AC
  Administered 2014-02-02 – 2014-02-03 (×5): 25 mg via ORAL
  Filled 2014-02-01 (×4): qty 1

## 2014-02-01 MED ORDER — LOPERAMIDE HCL 2 MG PO CAPS: 2.0000 mg | ORAL_CAPSULE | ORAL | Status: AC | PRN

## 2014-02-01 MED ORDER — VITAMIN B-1 100 MG PO TABS
100.0000 mg | ORAL_TABLET | Freq: Every day | ORAL | Status: DC
  Administered 2014-02-02 – 2014-02-08 (×6): 100 mg via ORAL
  Filled 2014-02-01 (×9): qty 1

## 2014-02-01 MED ORDER — ALUM & MAG HYDROXIDE-SIMETH 200-200-20 MG/5ML PO SUSP: 30.0000 mL | ORAL | Status: DC | PRN

## 2014-02-01 MED ORDER — ZOLPIDEM TARTRATE 5 MG PO TABS: 10.0000 mg | ORAL_TABLET | Freq: Every evening | ORAL | Status: DC | PRN

## 2014-02-01 MED ORDER — LORAZEPAM 1 MG PO TABS: 0.0000 mg | ORAL_TABLET | Freq: Four times a day (QID) | ORAL | Status: DC

## 2014-02-01 MED ORDER — THIAMINE HCL 100 MG/ML IJ SOLN: 100.0000 mg | Freq: Once | INTRAMUSCULAR | Status: DC

## 2014-02-01 MED ORDER — CHLORDIAZEPOXIDE HCL 25 MG PO CAPS
50.0000 mg | ORAL_CAPSULE | Freq: Once | ORAL | Status: AC
  Administered 2014-02-01: 50 mg via ORAL
  Filled 2014-02-01: qty 2

## 2014-02-01 MED ORDER — ONDANSETRON 4 MG PO TBDP: 4.0000 mg | ORAL_TABLET | Freq: Four times a day (QID) | ORAL | Status: AC | PRN

## 2014-02-01 MED ORDER — THIAMINE HCL 100 MG/ML IJ SOLN: 100.0000 mg | Freq: Every day | INTRAMUSCULAR | Status: DC

## 2014-02-01 MED ORDER — CHLORDIAZEPOXIDE HCL 25 MG PO CAPS
25.0000 mg | ORAL_CAPSULE | Freq: Four times a day (QID) | ORAL | Status: AC | PRN
  Filled 2014-02-01: qty 1

## 2014-02-01 MED ORDER — ACETAMINOPHEN 325 MG PO TABS: 650.0000 mg | ORAL_TABLET | ORAL | Status: DC | PRN

## 2014-02-01 MED ORDER — NICOTINE 21 MG/24HR TD PT24
21.0000 mg | MEDICATED_PATCH | Freq: Every day | TRANSDERMAL | Status: DC
  Administered 2014-02-01: 21 mg via TRANSDERMAL
  Filled 2014-02-01: qty 1

## 2014-02-01 MED ORDER — MAGNESIUM HYDROXIDE 400 MG/5ML PO SUSP: 30.0000 mL | Freq: Every day | ORAL | Status: DC | PRN

## 2014-02-01 MED ORDER — IBUPROFEN 400 MG PO TABS: 600.0000 mg | ORAL_TABLET | Freq: Three times a day (TID) | ORAL | Status: DC | PRN

## 2014-02-01 MED ORDER — HYDROXYZINE HCL 25 MG PO TABS
25.0000 mg | ORAL_TABLET | Freq: Four times a day (QID) | ORAL | Status: AC | PRN
  Administered 2014-02-02 – 2014-02-03 (×2): 25 mg via ORAL
  Filled 2014-02-01 (×2): qty 1

## 2014-02-01 MED ORDER — ADULT MULTIVITAMIN W/MINERALS CH
1.0000 | ORAL_TABLET | Freq: Every day | ORAL | Status: DC
  Administered 2014-02-02 – 2014-02-08 (×6): 1 via ORAL
  Filled 2014-02-01 (×9): qty 1

## 2014-02-01 MED ORDER — TRAZODONE HCL 50 MG PO TABS
50.0000 mg | ORAL_TABLET | Freq: Every evening | ORAL | Status: DC | PRN
  Administered 2014-02-01 – 2014-02-05 (×4): 50 mg via ORAL
  Filled 2014-02-01 (×5): qty 1

## 2014-02-01 MED ORDER — ACETAMINOPHEN 325 MG PO TABS
650.0000 mg | ORAL_TABLET | Freq: Four times a day (QID) | ORAL | Status: DC | PRN
  Administered 2014-02-02 – 2014-02-07 (×6): 650 mg via ORAL
  Filled 2014-02-01 (×5): qty 2

## 2014-02-01 MED ORDER — VITAMIN B-1 100 MG PO TABS
100.0000 mg | ORAL_TABLET | Freq: Every day | ORAL | Status: DC
  Administered 2014-02-01: 100 mg via ORAL
  Filled 2014-02-01: qty 1

## 2014-02-01 MED ORDER — LORAZEPAM 1 MG PO TABS: 0.0000 mg | ORAL_TABLET | Freq: Two times a day (BID) | ORAL | Status: DC

## 2014-02-01 MED ORDER — CHLORDIAZEPOXIDE HCL 25 MG PO CAPS: 25.0000 mg | ORAL_CAPSULE | Freq: Every day | ORAL | Status: AC

## 2014-02-01 MED ORDER — CHLORDIAZEPOXIDE HCL 25 MG PO CAPS
25.0000 mg | ORAL_CAPSULE | Freq: Three times a day (TID) | ORAL | Status: AC
Start: 2014-02-03 — End: 2014-02-04
  Administered 2014-02-03 (×2): 25 mg via ORAL
  Filled 2014-02-01 (×3): qty 1

## 2014-02-01 MED ORDER — CHLORDIAZEPOXIDE HCL 25 MG PO CAPS
25.0000 mg | ORAL_CAPSULE | ORAL | Status: AC
Start: 2014-02-04 — End: 2014-02-05
  Administered 2014-02-04: 25 mg via ORAL
  Filled 2014-02-01 (×2): qty 1

## 2014-02-01 MED ORDER — ONDANSETRON HCL 4 MG PO TABS: 4.0000 mg | ORAL_TABLET | Freq: Three times a day (TID) | ORAL | Status: DC | PRN

## 2014-02-01 NOTE — BH Assessment (Addendum)
Tele Assessment Note   [name] is a 33 y.o. single black male.  He presents at [facility] unaccompanied complaining of depression and SI.  Stressors: When asked about precipitating stressors contributing to his current problems, he replies, "It's probably because there's nothing going on in my life."  When asked about problems related to feelings of guilt, he becomes tearful, but his reply is evasive with regard to details.  Lethality: Suicidality: Pt reports SI, but he denies having a plan at this time.  He reports that in 09/2012, prior to his only admission to [facility], he experienced SI with plan to cut himself, but he did not actually attempt suicide.  He denies any history of suicide attempts.  However, he cannot contract for safety in the community at this time.  He denies any history of self mutilation.  Pt endorses depressed mood with symptoms noted in the "risk to self" assessment below. Homicidality: Pt reports recent HI, but not within the past few days.  He denies at any time having a plan or a specific intended victim in mind.  He also denies any recent problems with physical aggression.  He denies having access to firearms.  He denies any current legal problems.  He is calm and cooperative during assessment. Psychosis: Pt denies hallucinations.  Pt does not appear to be responding to internal stimuli and exhibits no delusional thought.  Pt's reality testing appears to be intact. Substance Abuse: Pt reports drinking about 12 beers daily for years.  He started drinking alcohol at 33 y/o, and his most recent use was 6 beers in the past 24 hours.  He denies any other substance abuse problems, but his UDS in positive for cannabinoids.  He denies any history of seizures or DT's in withdrawal, and denies any current withdrawal symptoms.  Please note that at 06:14 today his BAL tested at 174.  His CIWA at the time of assessment was 2.  Social History: Pt identifies his brother as his only social  support, but he seems to have some reservations about naming him.  Pt lives alone.  He has a 10th grade education.  He is a left leg below the knee amputee, and he receives disability benefits.  Treatment History: Pt was admitted to [facility] on 10/11/2012 for SI and for alcohol detoxification.  Shortly thereafter he was admitted to [facility] for detoxification or rehabilitation, and then was admitted to the [facility] residential facility for rehab.  This is the extent of his facility based treatment history.  He denies any history of outpatient treatment.  He briefly participated in 12-Step meetings.  Today pt is requesting admission to [facility].   Axis I: Mood Disorder NOS 296.90; Alcohol Dependence 303.90 Axis II: Deferred 799.9 Axis III:  Past Medical History  Diagnosis Date  . Eye problem 02/01/2014    Foreign object in left eye, about 2 months ago.   Axis IV: problems related to social environment and problems with primary support group Axis V: GAF = 35  Past Medical History:  Past Medical History  Diagnosis Date  . Eye problem 02/01/2014    Foreign object in left eye, about 2 months ago.    Past Surgical History  Procedure Laterality Date  . Leg amputation below knee      Family History: History reviewed. No pertinent family history.  Social History:  reports that he has been smoking Cigarettes.  He has been smoking about 0.50 packs per day. He does not have any smokeless  tobacco history on file. He reports that he drinks alcohol. He reports that he uses illicit drugs (Marijuana).  Additional Social History:  Alcohol / Drug Use Pain Medications: Reports using as prescribed Prescriptions: Reports using as prescribed Over the Counter: Denies History of alcohol / drug use?:  (Reports only alcohol problems; see UDS) Longest period of sobriety (when/how long): 90 days [facility] Negative Consequences of Use: Personal relationships;Legal;Financial Withdrawal Symptoms:  (None  currently; no history of seizures or DT's) Substance #1 Name of Substance 1: Alcohol (beer) 1 - Age of First Use: 33 y/o 1 - Amount (size/oz): 12 beers 1 - Frequency: daily 1 - Duration: years 1 - Last Use / Amount: 6 beers in the past 24 hours  CIWA: CIWA-Ar BP: 119/59 mmHg Pulse Rate: 74 Nausea and Vomiting: no nausea and no vomiting Tactile Disturbances: none Tremor: no tremor Auditory Disturbances: not present Paroxysmal Sweats: no sweat visible Visual Disturbances: not present Anxiety: no anxiety, at ease Headache, Fullness in Head: none present Agitation: normal activity Orientation and Clouding of Sensorium: oriented and can do serial additions CIWA-Ar Total: 0 COWS:    Allergies: No Known Allergies  Home Medications:  (Not in a hospital admission)  OB/GYN Status:  No LMP for male patient.  General Assessment Data Location of Assessment: [facility] Is this a Tele or Face-to-Face Assessment?: Tele Assessment Is this an Initial Assessment or a Re-assessment for this encounter?: Initial Assessment Living Arrangements: Alone Can pt return to current living arrangement?: Yes Admission Status: Voluntary Is patient capable of signing voluntary admission?: Yes Transfer from: Other (Comment) ([facility])     [facility] Living Arrangements: Alone Name of Psychiatrist: None Name of Therapist: None  Education Status Is patient currently in school?: No Highest grade of school patient has completed: 10th Contact person: [name] (brother) [phone]  Risk to self Suicidal Ideation: Yes-Currently Present Suicidal Intent: Yes-Currently Present Is patient at risk for suicide?: No Suicidal Plan?: No Access to Means: Yes Specify Access to Suicidal Means: Sharps (pt has considered cutting in the past) What has been your use of drugs/alcohol within the last 12 months?: Daily use of alcohol Previous Attempts/Gestures: No How many times?: 0 Other Self Harm  Risks: Not able to contract for safety in the community; Pt considered suicide by cutting in 09/2012 Triggers for Past Attempts: Other (Comment) (Not applicable) Intentional Self Injurious Behavior: None Family Suicide History: No Recent stressful life event(s): Other (Comment) ("It's probably because there's nothing going on in my life.") Persecutory voices/beliefs?: No Depression: Yes Depression Symptoms: Insomnia;Tearfulness;Isolating;Fatigue;Guilt;Loss of interest in usual pleasures;Feeling worthless/self pity;Feeling angry/irritable (Hopelessness; Unwilling to divulge reason for guilt.) Substance abuse history and/or treatment for substance abuse?: Yes (Daily use of alcohol) Suicide prevention information given to non-admitted patients: Not applicable (Tele-assessment: unable to provide)  Risk to Others Homicidal Ideation: Yes-Currently Present (Recent but not current.) Thoughts of Harm to Others: No Current Homicidal Intent: No Current Homicidal Plan: No Access to Homicidal Means: No Identified Victim: None History of harm to others?: No Assessment of Violence: None Noted Violent Behavior Description: Calm/cooperative Does patient have access to weapons?: Yes (Comment) (Denies having firearms) Criminal Charges Pending?: No Does patient have a court date: No  Psychosis Hallucinations: None noted Delusions: None noted  Mental Status Report Appear/Hygiene: Other (Comment) (Paper scrubs) Eye Contact: Fair Motor Activity: Unremarkable Speech: Soft (Low register; difficult to understand w/ acoustics in room.) Level of Consciousness: Alert Mood: Depressed;Other (Comment) (Became tearful when  discussing guilt) Affect: Other (Comment) (Constricted) Anxiety Level: None Thought Processes: Relevant;Coherent Judgement: Unimpaired Orientation: Person;Place;Time;Situation (Time: oriented except for date (off by 2) and hour) Obsessive Compulsive Thoughts/Behaviors: Moderate  (Cleaning)  Cognitive Functioning Concentration: Normal Memory: Remote Intact;Recent Intact IQ: Average Insight: Good Impulse Control: Fair Appetite: Fair (Diminished x 1 month) Weight Loss: 0 Weight Gain: 0 Sleep: Decreased (Problems persisting "forever") Total Hours of Sleep: 4 Vegetative Symptoms: Decreased grooming;Not bathing;Staying in bed  ADLScreening [facility] Assessment Services) Patient's cognitive ability adequate to safely complete daily activities?: Yes Patient able to express need for assistance with ADLs?: Yes Independently performs ADLs?: Yes (appropriate for developmental age)  Prior Inpatient Therapy Prior Inpatient Therapy: Yes Prior Therapy Dates: 10/11/2012: [facility] for SI and detox from alcohol Prior Therapy Facilty/Provider(s): 2014: [facility] for detox/rehab Reason for Treatment: 2014: [facility] [address] for rehab  Prior Outpatient Therapy Prior Outpatient Therapy: No Prior Therapy Dates: Hx of irregular participation in 12-Step meetings  ADL Screening (condition at time of admission) Patient's cognitive ability adequate to safely complete daily activities?: Yes Is the patient deaf or have difficulty hearing?: No Does the patient have difficulty seeing, even when wearing glasses/contacts?: No Does the patient have difficulty concentrating, remembering, or making decisions?: No Patient able to express need for assistance with ADLs?: Yes Does the patient have difficulty dressing or bathing?: No Independently performs ADLs?: Yes (appropriate for developmental age) Does the patient have difficulty walking or climbing stairs?: No Weakness of Legs: None Weakness of Arms/Hands: None  Home Assistive Devices/Equipment Home Assistive Devices/Equipment: Prosthesis (Left BKA prosthesis; may need new one soon.)    Abuse/Neglect Assessment (Assessment to be complete while patient is alone) Physical Abuse: Denies Verbal Abuse: Denies Sexual Abuse: Denies Exploitation of  patient/patient's resources: Denies Self-Neglect: Denies Values / Beliefs Cultural Requests During Hospitalization: None Spiritual Requests During Hospitalization: None   Advance Directives (For Healthcare) Advance Directive: Patient does not have advance directive (Tele-assessment: unable to provide packet) Pre-existing out of facility DNR order (yellow form or pink MOST form): No Nutrition Screen- [facility] Adult/[facility] Patient's home diet: Regular  Additional Information 1:1 In Past 12 Months?: No CIRT Risk: No Elopement Risk: No Does patient have medical clearance?: Yes     Disposition:  Disposition Initial Assessment Completed for this Encounter: Yes Disposition of Patient: Inpatient treatment program Type of inpatient treatment program: Adult After consulting with Claudette Head, NP @ 12:48 it has been determined that pt presents a life threatening danger to himself, and that he would benefit from facility based detoxification from alcohol.  Pt accepted to [facility] to the service of Geoffery Lyons, MD, Rm 303-2.  At 13:43 I spoke to EDP Devoria Albe, MD who concurs with this opinion.  At 13:44 I spoke to JJ, pt's nurse, to notify her.  She agrees to have pt sign Voluntary Admission and Consent for Treatment, to fax the signed form to [facility] [phone]), to send the original with the pt, and to call nurse-to-nurse report to [phone].  Doylene Canning, MA Triage Specialist Harriette Bouillon [facility] 02/01/2014 1:42 PM

## 2014-02-01 NOTE — Tx Team (Signed)
Initial Interdisciplinary Treatment Plan  PATIENT STRENGTHS: (choose at least two) Ability for insight Average or above average intelligence Supportive family/friends  PATIENT STRESSORS: Substance abuse Traumatic event   PROBLEM LIST: Problem List/Patient Goals Date to be addressed Date deferred Reason deferred Estimated date of resolution  Depression 02/01/14     Alcohol  02/01/14     Goal-"quit drinking"                                           DISCHARGE CRITERIA:  Improved stabilization in mood, thinking, and/or behavior Motivation to continue treatment in a less acute level of care Reduction of life-threatening or endangering symptoms to within safe limits Withdrawal symptoms are absent or subacute and managed without 24-hour nursing intervention  PRELIMINARY DISCHARGE PLAN: Attend 12-step recovery group  PATIENT/FAMIILY INVOLVEMENT: This treatment plan has been presented to and reviewed with the patient, [name], and/or family member,  The patient and family have been given the opportunity to ask questions and make suggestions.  [name] [name] 02/01/2014, 8:06 PM

## 2014-02-01 NOTE — Progress Notes (Signed)
Patient did attend the evening karaoke group. Pt was attentive, supportive, and participated by singing a song.   

## 2014-02-01 NOTE — BH Assessment (Signed)
[facility] Assessment Progress Note  At 12:00 I spoke to EDP Devoria AlbeIva Knapp, MD in anticipation of TTS assessment.  Doylene Canninghomas Raneshia Derick, MA Triage Specialist 02/01/2014 @ 14:03

## 2014-02-01 NOTE — Progress Notes (Signed)
Voluntary admission for a 33 yo.o male with flat affect, depressed mood who reports he has been drinking 12 beers daily with last use 01/30/14.  Reports that he has been drinking since the age of 33 and  is depressed and "got tired of drinking".  . Reports he had SI thoughts but no  Plan. Denies SI/HI and denies A/V hallucinations and contracts for safety.  Reports occasional THC use.  Pt. Is a left leg below knee amputee.  Rep[facility]  Pt. Remains safe.

## 2014-02-01 NOTE — ED Notes (Addendum)
Note made in error

## 2014-02-01 NOTE — ED Notes (Signed)
Pt states he feels like he is going to harm himself. Pt states he does not have a plan, but will hurt himself. Pt states he is tired.

## 2014-02-01 NOTE — ED Provider Notes (Signed)
CSN: [id number]     Arrival date & time 02/01/14  0550 History   First MD Initiated Contact with Patient 02/01/14 [phone]     Chief Complaint  Patient presents with  . V70.1     (Consider location/radiation/quality/duration/timing/severity/associated sxs/prior Treatment) HPI Patient reports he has a history of depression. He states he was last admitted in 2014 at behavioral health for depression and states he had a suicidal attempt with cutting his wrists. He was going to day [place] after that admission but he quit going. He states there was no reason why he quit going. He states he was on medication when he was in the hospital which helped. He did continue it for a while after his discharge. He relates "I'm tired of everything". He states he feels depressed and it just started yesterday. He states he went to the store and bought a bottle of St. John's wort and he took one tablet. He states it did not help. He states he feels suicidal however he does not have a plan. He denies any specific life events that has made him feel depressed yesterday. He does report drinking a sixpack of beer daily since he was 16. He does admit he thinks alcohol is a problem.   PCP none  Past Medical History  Diagnosis Date  . Eye problem 02/01/2014    Foreign object in left eye, about 2 months ago.   Past Surgical History  Procedure Laterality Date  . Leg amputation below knee     History reviewed. No pertinent family history. History  Substance Use Topics  . Smoking status: Current Every Day Smoker -- 0.50 packs/day    Types: Cigarettes  . Smokeless tobacco: Not on file  . Alcohol Use: Yes     Comment: 6pk or more daily  on disability b/o amputation of LLE Drinking 6 ppd since 33 yo  Review of Systems  All other systems reviewed and are negative.     Allergies  Review of patient's allergies indicates no known allergies.  Home Medications   Current Outpatient Rx  Name  Route  Sig  Dispense  Refill   . HYDROcodone-acetaminophen (NORCO/VICODIN) 5-325 MG per tablet      Take one-two tabs po q 4-6 hrs prn pain   10 tablet   0    BP 139/71  Pulse 96  Temp(Src) 98.2 F (36.8 C) (Oral)  Resp 18  Ht 6' (1.829 m)  Wt 190 lb (86.183 kg)  BMI 25.76 kg/m2  SpO2 97%  Vital signs normal   Physical Exam  Nursing note and vitals reviewed. Constitutional: He is oriented to person, place, and time. He appears well-developed and well-nourished.  Non-toxic appearance. He does not appear ill. No distress.  HENT:  Head: Normocephalic and atraumatic.  Right Ear: External ear normal.  Left Ear: External ear normal.  Nose: Nose normal. No mucosal edema or rhinorrhea.  Mouth/Throat: Oropharynx is clear and moist and mucous membranes are normal. No dental abscesses or uvula swelling.  Eyes: Conjunctivae and EOM are normal. Pupils are equal, round, and reactive to light.  Neck: Normal range of motion and full passive range of motion without pain. Neck supple.  Cardiovascular: Normal rate, regular rhythm and normal heart sounds.  Exam reveals no gallop and no friction rub.   No murmur heard. Pulmonary/Chest: Effort normal and breath sounds normal. No respiratory distress. He has no wheezes. He has no rhonchi. He has no rales. He exhibits no tenderness and no crepitus.  Abdominal: Soft. Normal appearance and bowel sounds are normal. He exhibits no distension. There is no tenderness. There is no rebound and no guarding.  Musculoskeletal: Normal range of motion. He exhibits no edema and no tenderness.  Moves all extremities well.   Neurological: He is oriented to person, place, and time. He has normal strength. No cranial nerve deficit.  sleepy  Skin: Skin is warm, dry and intact. No rash noted. No erythema. No pallor.  Psychiatric: His affect is blunt. His speech is delayed. He is slowed. He expresses suicidal ideation. He expresses no homicidal ideation. He expresses no suicidal plans.    ED  Course  Procedures (including critical care time)  Medications  acetaminophen (TYLENOL) tablet 650 mg (not administered)  ibuprofen (ADVIL,MOTRIN) tablet 600 mg (not administered)  zolpidem (AMBIEN) tablet 10 mg (not administered)  nicotine (NICODERM CQ - dosed in mg/24 hours) patch 21 mg (21 mg Transdermal Patch Applied 02/01/14 1013)  ondansetron (ZOFRAN) tablet 4 mg (not administered)  alum & mag hydroxide-simeth (MAALOX/MYLANTA) 200-200-20 MG/5ML suspension 30 mL (not administered)  LORazepam (ATIVAN) tablet 0-4 mg (0 mg Oral Not Given 02/01/14 1318)    Followed by  LORazepam (ATIVAN) tablet 0-4 mg (not administered)  thiamine (VITAMIN B-1) tablet 100 mg (100 mg Oral Given 02/01/14 1012)    Or  thiamine (B-1) injection 100 mg ( Intravenous See Alternative 02/01/14 1012)      12:04 Tom,TSS called to discuss reason for consult  13:43 Tom, TSS called, patient is accepted at [facility] by Dr Dub Mikes, will be voluntary admission  Labs Review Results for orders placed during the hospital encounter of 02/01/14  CBC WITH DIFFERENTIAL      Result Value Ref Range   WBC 7.2  4.0 - 10.5 K/uL   RBC 4.43  4.22 - 5.81 MIL/uL   Hemoglobin 13.8  13.0 - 17.0 g/dL   HCT 16.1  09.6 - 04.5 %   MCV 91.9  78.0 - 100.0 fL   MCH 31.2  26.0 - 34.0 pg   MCHC 33.9  30.0 - 36.0 g/dL   RDW 40.9  81.1 - 91.4 %   Platelets 206  150 - 400 K/uL   Neutrophils Relative % 61  43 - 77 %   Neutro Abs 4.4  1.7 - 7.7 K/uL   Lymphocytes Relative 30  12 - 46 %   Lymphs Abs 2.1  0.7 - 4.0 K/uL   Monocytes Relative 7  3 - 12 %   Monocytes Absolute 0.5  0.1 - 1.0 K/uL   Eosinophils Relative 2  0 - 5 %   Eosinophils Absolute 0.1  0.0 - 0.7 K/uL   Basophils Relative 1  0 - 1 %   Basophils Absolute 0.1  0.0 - 0.1 K/uL  BASIC METABOLIC PANEL      Result Value Ref Range   Sodium 139  137 - 147 mEq/L   Potassium 4.1  3.7 - 5.3 mEq/L   Chloride 102  96 - 112 mEq/L   CO2 22  19 - 32 mEq/L   Glucose, Bld 96  70 - 99 mg/dL   BUN 16   6 - 23 mg/dL   Creatinine, Ser 7.82  0.50 - 1.35 mg/dL   Calcium 9.0  8.4 - 95.6 mg/dL   GFR calc non Af Amer 87 (*) >90 mL/min   GFR calc Af Amer >90  >90 mL/min  ETHANOL      Result Value Ref Range   Alcohol, Ethyl (  B) 174 (*) 0 - 11 mg/dL  URINE RAPID DRUG SCREEN (HOSP PERFORMED)      Result Value Ref Range   Opiates NONE DETECTED  NONE DETECTED   Cocaine NONE DETECTED  NONE DETECTED   Benzodiazepines NONE DETECTED  NONE DETECTED   Amphetamines NONE DETECTED  NONE DETECTED   Tetrahydrocannabinol POSITIVE (*) NONE DETECTED   Barbiturates NONE DETECTED  NONE DETECTED   Laboratory interpretation all normal except alcohol intoxication, + UDS    Imaging Review No results found.   EKG Interpretation None      MDM   Final diagnoses:  Alcohol abuse  Depression  Suicidal ideation   [facility]  Devoria AlbeIva Raisha Brabender, MD, Franz DellFACEP    Klayten Jolliff L Veera Stapleton, MD 02/01/14 1345

## 2014-02-02 ENCOUNTER — Encounter (HOSPITAL_COMMUNITY): Payer: Self-pay | Admitting: Psychiatry

## 2014-02-02 DIAGNOSIS — R45851 Suicidal ideations: Secondary | ICD-10-CM

## 2014-02-02 LAB — COMPREHENSIVE METABOLIC PANEL
ALT: 29 U/L (ref 0–53)
AST: 24 U/L (ref 0–37)
Albumin: 4.2 g/dL (ref 3.5–5.2)
Alkaline Phosphatase: 84 U/L (ref 39–117)
BUN: 21 mg/dL (ref 6–23)
CALCIUM: 9.6 mg/dL (ref 8.4–10.5)
CO2: 28 mEq/L (ref 19–32)
CREATININE: 1.58 mg/dL — AB (ref 0.50–1.35)
Chloride: 98 mEq/L (ref 96–112)
GFR, EST AFRICAN AMERICAN: 66 mL/min — AB (ref >90)
GFR, EST NON AFRICAN AMERICAN: 57 mL/min — AB (ref >90)
GLUCOSE: 87 mg/dL (ref 70–99)
Potassium: 4.7 mEq/L (ref 3.7–5.3)
Sodium: 136 mEq/L — ABNORMAL LOW (ref 137–147)
Total Bilirubin: 0.3 mg/dL (ref 0.3–1.2)
Total Protein: 7.7 g/dL (ref 6.0–8.3)

## 2014-02-02 NOTE — [facility] Group Notes (Signed)
[facility] LCSW Group Therapy  02/02/2014 3:03 PM  Type of Therapy:  Group Therapy  Participation Level:  Active  Participation Quality:  Attentive  Affect:  Depressed and Flat  Cognitive:  Alert and Oriented  Insight:  Improving  Engagement in Therapy:  Improving  Modes of Intervention:  Confrontation, Discussion, Education, Exploration, Problem-solving, Rapport Building, Socialization and Support  Summary of Progress/Problems: Feelings around Relapse. Group members discussed the meaning of relapse and shared personal stories of relapse, how it affected them and others, and how they perceived themselves during this time. Group members were encouraged to identify triggers, warning signs and coping skills used when facing the possibility of relapse. Social supports were discussed and explored in detail. Post Acute Withdrawal Syndrome (handout provided) was introduced and examined. Pt's were encouraged to ask questions, talk about key points associated with PAWS, and process this information in terms of relapse prevention. [name] was attentive and engaged throughout today's therapy group. 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 [name] shared his experiences with symptoms. He shows progress in the group setting and improving insight AEB his ability to process how having a safety plan and support network would help prevent relapse.    Genene Kilman Smart LCSWA  02/02/2014, 3:03 PM

## 2014-02-02 NOTE — Progress Notes (Signed)
D: Pt denies SI/HI/AVH. Pt is pleasant and cooperative. Pt complained of pain with   BKA prosthesis, that the fit is not a good one and he needs  A prescription to get a proper fitting prosthesis is needed.   A: Pt was offered support and encouragement. Pt was given scheduled medications. Pt was encourage to attend groups. Q 15 minute checks were done for safety.   R:Pt attends groups and interacts well with peers and staff. Pt is taking medication. Pt has no complaints at this time.Pt receptive to treatment and safety maintained on unit.

## 2014-02-02 NOTE — [facility] Suicide Risk Assessment (Signed)
Suicide Risk Assessment  Admission Assessment     Nursing information obtained from:  Patient Demographic factors:  Unemployed Current Mental Status:   (denies) Loss Factors:  Financial problems / change in socioeconomic status Historical Factors:  Family history of mental illness or substance abuse Risk Reduction Factors:  Living with another person, especially a relative;Positive social support Total Time spent with patient: 45 minutes  CLINICAL FACTORS:   Depression:   Comorbid alcohol abuse/dependence Alcohol/Substance Abuse/Dependencies  Psychiatric Specialty Exam:     Blood pressure 120/81, pulse 86, temperature 97.4 F (36.3 C), temperature source Oral, resp. rate 16, height 5\' 11"  (1.803 m), weight 88.451 kg (195 lb), SpO2 98.00%.Body mass index is 27.21 kg/(m^2).  General Appearance: Fairly Groomed  Patent attorneyye Contact::  Fair  Speech:  Clear and Coherent  Volume:  Decreased  Mood:  Anxious and sad, worried  Affect:  Restricted  Thought Process:  Coherent and Goal Directed  Orientation:  Full (Time, Place, and Person)  Thought Content:  symtpoms, worries, concerns  Suicidal Thoughts:  No  Homicidal Thoughts:  No  Memory:  Immediate;   Fair Recent;   Fair Remote;   Fair  Judgement:  Fair  Insight:  Present  Psychomotor Activity:  Restlessness  Concentration:  Fair  Recall:  FiservFair  Fund of Knowledge:NA  Language: Fair  Akathisia:  No  Handed:    AIMS (if indicated):     Assets:  Desire for Improvement Resilience  Sleep:  Number of Hours: 5.25   Musculoskeletal: Strength & Muscle Tone: within normal limits Gait & Station: below the knee amputee left leg Patient leans: N/A  COGNITIVE FEATURES THAT CONTRIBUTE TO RISK:  Closed-mindedness Polarized thinking Thought constriction (tunnel vision)    SUICIDE RISK:   Moderate:   PLAN OF CARE: Supportive approach/coping skills/relapse prevention                              Librium detox protocol           Reassess co morbidities                              Explore rehab options  I certify that inpatient services furnished can reasonably be expected to improve the patient's condition.  Rachael Feerving A Shalika Arntz 02/02/2014, 4:54 PM

## 2014-02-02 NOTE — Tx Team (Signed)
Interdisciplinary Treatment Plan Update (Adult)  Date: 02/02/2014   Time Reviewed: 11:03 AM  Progress in Treatment:  Attending groups: Yes  Participating in groups:  Yes  Taking medication as prescribed: Yes  Tolerating medication: Yes  Family/Significant othe contact made: Not yet. SPE required for this pt.   Patient understands diagnosis: Yes, AEB seeking treatment for ETOH detox, marijuana use, passive SI/depression, and medication management.  Discussing patient identified problems/goals with staff: Yes  Medical problems stabilized or resolved: Yes  Denies suicidal/homicidal ideation: Yes during group/self report.  Patient has not harmed self or Others: Yes  New problem(s) identified:  Discharge Plan or Barriers: Pt hoping for [facility] referral and plans to [facility] if he returns to [place] after treatment. Pt currently lives alone and is thinking about moving in with sister in [place].  Additional comments:  Voluntary admission for a 33 yo.o male with flat affect, depressed mood who reports he has been drinking 12 beers daily with last use 01/30/14. Reports that he has been drinking since the age of 33 and is depressed and "got tired of drinking". . Reports he had SI thoughts but no Plan. Denies SI/HI and denies A/V hallucinations and contracts for safety. Reports occasional THC use. Pt. Is a left leg below knee amputee. Reports that he [facility]. Pt. Remains safe.     Reason for Continuation of Hospitalization: Librium taper-withdrawals Mood stabilization Estimated length of stay: 3-5 days  For review of initial/current patient goals, please see plan of care.  Attendees:  Patient:    Family:    Physician: Geoffery LyonsIrving Lugo MD 02/02/2014 11:03 AM   Nursing: Darden DatesJennifer C. Nurse CM  02/02/2014 11:03 AM   Clinical Social Worker Joycie Aerts Smart, LCSWA  02/02/2014 11:03 AM   Other:    Other:    Other   Other:    Scribe for Treatment Team:   Trula SladeHeather Smart LCSWA 02/02/2014 11:03 AM
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
## 2014-02-02 NOTE — [facility] Group Notes (Signed)
[facility] LCSW [facility] Discharge Planning Group Note   02/02/2014 10:03 AM  Participation Quality:  Appropriate   Mood/Affect:  Appropriate  Depression Rating:  6  Anxiety Rating:  6  Thoughts of Suicide:  No Will you contract for safety?   NA  Current AVH:  No  Plan for Discharge/Comments:  Pt reports that he lives alone in [place] and would like [facility]. Pt not eligible for [facility]. No withdrawals identified. Pt slept well and is pleasant/cooperative during group.   Transportation Means: unknown at this time/likely family   Supports: family supports   Berkshire HathawayHeather Smart LCSWA

## 2014-02-02 NOTE — Progress Notes (Signed)
D: Patient's affect appropriate to circumstance and mood is anxious. He reported on the self inventory sheet that he's sleeping fair, appetite is improving, normal energy level and good ability to pay attention. Patient rated depression "6" and feelings of hopelessness "5". He's participating in groups and interacting with peers in the dayroom. Patient adheres to current medication regimen.  A: Support and encouragement provided to patient. Administered scheduled medications per ordering MD. Monitor Q15 minute checks for safety.  R: Patient receptive. Denies SI/HI and AVH. Patient remains safe on the unit.

## 2014-02-02 NOTE — [facility] Suicide Risk Assessment (Signed)
[facility] INPATIENT:  Family/Significant Other Suicide Prevention Education  Suicide Prevention Education:  Contact Attempts: [name] (pt's sister) [phone] has been identified by the patient as the family member/significant other with whom the patient will be residing, and identified as the person(s) who will aid the patient in the event of a mental health crisis.  With written consent from the patient, two attempts were made to provide suicide prevention education, prior to and/or following the patient's discharge.  We were unsuccessful in providing suicide prevention education.  A suicide education pamphlet was given to the patient to share with family/significant other.  Date and time of first attempt: 9:15AM 02/02/14 Date and time of second attempt: 11:45AM 02/02/14 (generic voicemail left requesting call back)   The Sherwin-WilliamsHeather Smart LCSWA  02/02/2014, 11:47 AM
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
## 2014-02-03 DIAGNOSIS — F1994 Other psychoactive substance use, unspecified with psychoactive substance-induced mood disorder: Secondary | ICD-10-CM

## 2014-02-03 DIAGNOSIS — F1020 Alcohol dependence, uncomplicated: Secondary | ICD-10-CM

## 2014-02-03 NOTE — [facility] Group Notes (Signed)
[facility] Group Notes:  (Nursing/MHT/Case Management/Adjunct)  Date:  02/03/2014  Time:  1:44 PM  Type of Therapy:  Psychoeducational Skills  Participation Level:  Active  Participation Quality:  Appropriate  Affect:  Appropriate  Cognitive:  Appropriate  Insight:  Appropriate  Engagement in Group:  Engaged  Modes of Intervention:  Discussion, Education, Problem-solving and Support  Summary of Progress/Problems:  Watched video on addiction and its impact on daily lives.  Group discussion on video, information learned and changes that need to be made to be successful post discharge.  [name] R [name] 02/03/2014, 1:44 PM 

## 2014-02-03 NOTE — Progress Notes (Signed)
D.  Pt pleasant on approach, denies complaints at this time, denies withdrawal symptoms.  Denies SI/HI/hallucinations at this time.  Interacting appropriately within milieu, positive for evening wrap up group.  A.  Support and encouragement offered  R.  Pt remains safe on unit, will continue to monitor.

## 2014-02-03 NOTE — Progress Notes (Signed)
Adult Psychoeducational Group Note  Date:  02/03/2014 Time:  0900  Group Topic/Focus: Self Inventory  Participation Level:  Active  Participation Quality:  Appropriate and Attentive  Affect:  Appropriate and Flat  Cognitive:  Alert and Appropriate  Insight: Appropriate  Engagement in Group:  Engaged  Modes of Intervention:  Clarification  Additional Comments:  Pt concerned about receiving a prescription for a new prosthetic leg.  Annabell Howellsric Ivan Cherylanne Ardelean 02/03/2014, 10:39 AM

## 2014-02-03 NOTE — [facility] Group Notes (Signed)
[facility] LCSW Group Therapy  02/03/2014 12:34 PM  Type of Therapy:  Group Therapy  Participation Level:  Active  Participation Quality:  Appropriate  Affect:  Appropriate  Cognitive:  Alert and Oriented  Insight:  Developing/Improving and Supportive  Engagement in Therapy:  Developing/Improving, Engaged and Supportive  Modes of Intervention:  Discussion, Education, Exploration, Rapport Building and Socialization  Summary of Progress/Problems: Pt joined the group after it started.  Pt was able to identify a positive supports as well as a coping skill he can use when supports are not available.  Pt was supportive of other members and engaged in discussion.   [name] SpatesRachel Anne Abeeha Twist 02/03/2014, 12:34 PM

## 2014-02-03 NOTE — Progress Notes (Signed)
[facility] Group Notes:  (Nursing/MHT/Case Management/Adjunct)  Date:  02/03/2014  Time:  10:42 PM  Type of Therapy:  Group Therapy  Participation Level:  Active  Participation Quality:  Appropriate  Affect:  Appropriate  Cognitive:  Appropriate  Insight:  Appropriate  Engagement in Group:  Engaged  Modes of Intervention:  Socialization and Support  Summary of Progress/Problems: Pt. Stated his energy was 10. Pt. Stated he would exercise and play sports as a healthy coping skill.  Pt. Stated he wanted to utilize all sources and stay focus on his recovery.  [name] 02/03/2014, 10:42 PM

## 2014-02-03 NOTE — Progress Notes (Signed)
[facility] MD Progress Note  02/03/2014 3:01 PM [name]  MRN:  [id number] Subjective:   Patient states "I came to get help with my drinking. I am have been drinking heavily. I noticed on Easter how crappy I felt after drinking the night before. My Uncle gave me a beer and I felt better right away. That helped me realize that things had gotten out of hand."  Objective:  Patient is visible on the unit and attending the scheduled groups. Rates his depression and anxiety at five. Patient endorses ongoing symptoms of depression. He reports things have gotten worse since being charged with DUI in 2013. [name] has an upcoming court date related to that charge. Patient relates being in two treatment centers last year. He feels that his drinking is triggered by associates who also regularly use alcohol. The patient was not taking any medications for mental health prior to admission. Patient is preoccupied with getting a prescription written for a new prosthetic leg. He reports having an appointment but was told the practice is not taking new patients. Patient encouraged since he has medicaid to find a Provider who can write this for him, then patient plans to go back to [place] to have prosthetic made.   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  Eye problem  02/01/2014     Foreign object in left eye, about 2 months ago.    AXIS IV: other psychosocial or environmental problems and Alcoholism  AXIS V: 41-50 serious symptoms   ADL's:  Intact  Sleep: Fair  Appetite:  Fair  Suicidal Ideation:  Denies Homicidal  Ideation:  Denies AEB (as evidenced by):  Psychiatric Specialty Exam: Physical Exam  Review of Systems  Constitutional: Negative.   HENT: Negative.   Eyes: Negative.   Respiratory: Negative.   Cardiovascular: Negative.   Gastrointestinal: Negative.   Genitourinary: Negative.   Musculoskeletal: Positive for joint pain (Report his left stump has shrunk and is now too small for prosthesis. ).  Skin: Negative.   Neurological: Negative.   Endo/Heme/Allergies: Negative.   Psychiatric/Behavioral: Positive for depression and substance abuse. Negative for suicidal ideas, hallucinations and memory loss. The patient is nervous/anxious and has insomnia.     Blood pressure 152/89, pulse 84, temperature 97.4 F (36.3 C), temperature source Oral, resp. rate 18, height $RemoveBe'5\' 11"'PlldoXAJK$  (1.803 m), weight 88.451 kg (195 lb), SpO2 98.00%.Body mass index is 27.21 kg/(m^2).  General Appearance: Casual  Eye Contact::  Good  Speech:  Clear and Coherent  Volume:  Normal  Mood:  Dysphoric  Affect:  Full Range  Thought Process:  Goal Directed and Intact  Orientation:  Full (Time, Place, and Person)  Thought Content:  WDL  Suicidal Thoughts:  No  Homicidal Thoughts:  No  Memory:  Immediate;   Good Recent;   Good Remote;   Good  Judgement:  Fair  Insight:  Fair  Psychomotor Activity:  Tremor  Concentration:  Good  Recall:  Good  Fund of Knowledge:Fair  Language: Good  Akathisia:  No  Handed:  Right  AIMS (if indicated):     Assets:  Communication Skills Desire for Improvement Leisure Time  Resilience Social Support  Sleep:  Number of Hours: 6.5   Musculoskeletal: Strength & Muscle Tone: within normal limits Gait & Station: normal 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 acetaminophen (TYLENOL) tablet 650 mg  650 mg Oral Q6H PRN Lurena Nida, NP   650 mg at  02/02/14 2217  . alum & mag hydroxide-simeth (MAALOX/MYLANTA) 200-200-20 MG/5ML suspension 30 mL  30 mL Oral Q4H PRN Lurena Nida, NP      . chlordiazePOXIDE (LIBRIUM) capsule 25 mg  25 mg Oral Q6H PRN Lurena Nida, NP      . chlordiazePOXIDE (LIBRIUM) capsule 25 mg  25 mg Oral TID Lurena Nida, NP   25 mg at 02/03/14 1311   Followed by  . [START ON 02/04/2014] chlordiazePOXIDE (LIBRIUM) capsule 25 mg  25 mg Oral BH-qamhs Lurena Nida, NP       Followed by  . [START ON 02/06/2014] chlordiazePOXIDE (LIBRIUM) capsule 25 mg  25 mg Oral Daily Lurena Nida, NP      . hydrOXYzine (ATARAX/VISTARIL) tablet 25 mg  25 mg Oral Q6H PRN Lurena Nida, NP   25 mg at 02/02/14 2217  . loperamide (IMODIUM) capsule 2-4 mg  2-4 mg Oral PRN Lurena Nida, NP      . magnesium hydroxide (MILK OF MAGNESIA) suspension 30 mL  30 mL Oral Daily PRN Lurena Nida, NP      . multivitamin with minerals tablet 1 tablet  1 tablet Oral Daily Lurena Nida, NP   1 tablet at 02/03/14 0827  . ondansetron (ZOFRAN-ODT) disintegrating tablet 4 mg  4 mg Oral Q6H PRN Lurena Nida, NP      . thiamine (B-1) injection 100 mg  100 mg Intramuscular Once Lurena Nida, NP      . thiamine (VITAMIN B-1) tablet 100 mg  100 mg Oral Daily Lurena Nida, NP   100 mg at 02/03/14 0800  . traZODone (DESYREL) tablet 50 mg  50 mg Oral QHS PRN Lurena Nida, NP   50 mg at 02/02/14 2217    Lab Results:  Results for orders placed during the hospital encounter of 02/01/14 (from the past 48 hour(s))  COMPREHENSIVE METABOLIC PANEL     Status: Abnormal   Collection Time    02/02/14  8:00 PM      Result Value Ref Range   Sodium 136 (*) 137 - 147 mEq/L   Potassium 4.7  3.7 - 5.3 mEq/L   Chloride 98  96 - 112 mEq/L   CO2 28  19 - 32 mEq/L   Glucose, Bld 87  70 - 99 mg/dL   BUN 21  6 - 23 mg/dL   Creatinine, Ser 1.58 (*) 0.50 - 1.35 mg/dL   Calcium 9.6  8.4 - 10.5 mg/dL   Total Protein 7.7  6.0 - 8.3 g/dL   Albumin 4.2  3.5 - 5.2 g/dL   AST 24  0  - 37 U/L   ALT 29  0 - 53 U/L   Alkaline Phosphatase 84  39 - 117 U/L   Total Bilirubin 0.3  0.3 - 1.2 mg/dL   GFR calc non Af Amer 57 (*) >90 mL/min   GFR calc Af Amer 66 (*) >90 mL/min   Comment: (NOTE)     The eGFR has been calculated using the CKD  EPI equation.     This calculation has not been validated in all clinical situations.     eGFR's persistently <90 mL/min signify possible Chronic Kidney     Disease.     Performed at [facility]    Physical Findings: AIMS:  , ,  ,  ,    CIWA:  CIWA-Ar Total: 0 COWS:     Treatment Plan Summary: Daily contact with patient to assess and evaluate symptoms and progress in treatment Medication management  Plan: 1. Continue crisis management and stabilization.  2. Medication management: Continue librium protocol for alcohol withdrawal, Trazodone 50 mg hs prn insomnia. Will talk to patient about starting SSRI for his depression.  3. Encouraged patient to attend groups and participate in group counseling sessions and activities.  4. Discharge plan in progress.  5. Continue current treatment plan.  6. Address health issues: Vitals reviewed and stable.   Medical Decision Making Problem Points:  Established problem, stable/improving (1), Review of last therapy session (1) and Review of psycho-social stressors (1) Data Points:  Review or order clinical lab tests (1) Review of medication regiment & side effects (2) Review of new medications or change in dosage (2)  I certify that inpatient services furnished can reasonably be expected to improve the patient's condition.   Elmarie Shiley NP-C 02/03/2014, 3:01 PM Patient seen, evaluated and I agree with notes by Nurse Practitioner. Corena Pilgrim, MD

## 2014-02-04 DIAGNOSIS — F1994 Other psychoactive substance use, unspecified with psychoactive substance-induced mood disorder: Secondary | ICD-10-CM

## 2014-02-04 NOTE — [facility] Group Notes (Signed)
[facility] Group Notes:  (Nursing/MHT/Case Management/Adjunct)  Date:  02/04/2014  Time: 0900 am  Type of Therapy:  Self Inventory  Participation Level:  Active  Participation Quality:  Appropriate and Attentive  Affect:  Appropriate  Cognitive:  Alert and Appropriate  Insight:  Good  Engagement in Group:  Engaged  Modes of Intervention:  Support  Summary of Progress/Problems: [name] states he is sleeping well without the trazodone and librium.  He feels they are too sedating.  He would like a referral to an MD who can give him a new mode for his prothesis.  He states he needs a referral and a prescription.  He is satisfied with his treatment here.    Cresenciano GenreCaroline Evans Tracey Hermance 02/04/2014, 9:56 AM

## 2014-02-04 NOTE — Progress Notes (Signed)
Patient ID: [name], male   DOB: 1981-06-20, 33 y.o.   MRN: [id number] [facility] MD Progress Note  02/04/2014 1:21 PM PHYLLIS [name]  MRN:  [id number] Subjective:   Patient states "I am doing pretty well. Just feel tired from taking the librium. I'm ready for treatment and to get back to my life."   Objective:  Patient is visible on the unit and attending the scheduled groups. Rates his depression and anxiety at three. These ratings show a decrease from yesterday. Patient endorses decreased symptoms of depression. He is active on the unit and attending the scheduled groups. Patient is compliant with his scheduled medications. His only report reaction is drowsiness associated with librium protocol. However, the patient still continues to function very well on the unit.   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  Eye problem  02/01/2014     Foreign object in left eye, about 2 months ago.    AXIS IV: other psychosocial or environmental problems and Alcoholism  AXIS V: 41-50 serious symptoms   ADL's:  Intact  Sleep: Fair  Appetite:  Fair  Suicidal Ideation:  Denies Homicidal Ideation:  Denies AEB (as evidenced by):  Psychiatric Specialty Exam: Physical Exam  Review of Systems  Constitutional: Negative.   HENT: Negative.   Eyes: Negative.   Respiratory: Negative.   Cardiovascular: Negative.   Gastrointestinal: Negative.   Genitourinary: Negative.   Musculoskeletal: Positive for joint pain (Report his left stump has shrunk and is now too small for prosthesis. ).  Skin: Negative.   Neurological:  Negative.   Endo/Heme/Allergies: Negative.   Psychiatric/Behavioral: Positive for depression and substance abuse. Negative for suicidal ideas, hallucinations and memory loss. The patient is nervous/anxious and has insomnia.     Blood pressure 147/89, pulse 73, temperature 97.6 F (36.4 C), temperature source Oral, resp. rate 16, height 5' 11"  (1.803 m), weight 88.451 kg (195 lb), SpO2 98.00%.Body mass index is 27.21 kg/(m^2).  General Appearance: Casual  Eye Contact::  Good  Speech:  Clear and Coherent  Volume:  Normal  Mood:  Dysphoric  Affect:  Full Range  Thought Process:  Goal Directed and Intact  Orientation:  Full (Time, Place, and Person)  Thought Content:  WDL  Suicidal Thoughts:  No  Homicidal Thoughts:  No  Memory:  Immediate;   Good Recent;   Good Remote;   Good  Judgement:  Fair  Insight:  Fair  Psychomotor Activity:  Tremor  Concentration:  Good  Recall:  Good  Fund of Knowledge:Fair  Language: Good  Akathisia:  No  Handed:  Right  AIMS (if indicated):     Assets:  Communication Skills Desire for Improvement Leisure Time Resilience Social Support  Sleep:  Number of Hours: 6   Musculoskeletal: Strength & Muscle Tone: within normal limits Gait & Station: normal 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 acetaminophen (TYLENOL) tablet 650 mg  650 mg Oral Q6H PRN Lurena Nida, NP   650 mg at 02/03/14 2232  . alum & mag hydroxide-simeth (  MAALOX/MYLANTA) 200-200-20 MG/5ML suspension 30 mL  30 mL Oral Q4H PRN Lurena Nida, NP      . chlordiazePOXIDE (LIBRIUM) capsule 25 mg  25 mg Oral Q6H PRN Lurena Nida, NP      . chlordiazePOXIDE (LIBRIUM) capsule 25 mg  25 mg Oral BH-qamhs Lurena Nida, NP       Followed by  . [START ON 02/06/2014] chlordiazePOXIDE (LIBRIUM) capsule 25 mg  25 mg Oral Daily Lurena Nida, NP      .  hydrOXYzine (ATARAX/VISTARIL) tablet 25 mg  25 mg Oral Q6H PRN Lurena Nida, NP   25 mg at 02/03/14 2230  . loperamide (IMODIUM) capsule 2-4 mg  2-4 mg Oral PRN Lurena Nida, NP      . magnesium hydroxide (MILK OF MAGNESIA) suspension 30 mL  30 mL Oral Daily PRN Lurena Nida, NP      . multivitamin with minerals tablet 1 tablet  1 tablet Oral Daily Lurena Nida, NP   1 tablet at 02/03/14 0827  . ondansetron (ZOFRAN-ODT) disintegrating tablet 4 mg  4 mg Oral Q6H PRN Lurena Nida, NP      . thiamine (B-1) injection 100 mg  100 mg Intramuscular Once Lurena Nida, NP      . thiamine (VITAMIN B-1) tablet 100 mg  100 mg Oral Daily Lurena Nida, NP   100 mg at 02/03/14 0800  . traZODone (DESYREL) tablet 50 mg  50 mg Oral QHS PRN Lurena Nida, NP   50 mg at 02/03/14 2229    Lab Results:  Results for orders placed during the hospital encounter of 02/01/14 (from the past 48 hour(s))  COMPREHENSIVE METABOLIC PANEL     Status: Abnormal   Collection Time    02/02/14  8:00 PM      Result Value Ref Range   Sodium 136 (*) 137 - 147 mEq/L   Potassium 4.7  3.7 - 5.3 mEq/L   Chloride 98  96 - 112 mEq/L   CO2 28  19 - 32 mEq/L   Glucose, Bld 87  70 - 99 mg/dL   BUN 21  6 - 23 mg/dL   Creatinine, Ser 1.58 (*) 0.50 - 1.35 mg/dL   Calcium 9.6  8.4 - 10.5 mg/dL   Total Protein 7.7  6.0 - 8.3 g/dL   Albumin 4.2  3.5 - 5.2 g/dL   AST 24  0 - 37 U/L   ALT 29  0 - 53 U/L   Alkaline Phosphatase 84  39 - 117 U/L   Total Bilirubin 0.3  0.3 - 1.2 mg/dL   GFR calc non Af Amer 57 (*) >90 mL/min   GFR calc Af Amer 66 (*) >90 mL/min   Comment: (NOTE)     The eGFR has been calculated using the CKD EPI equation.     This calculation has not been validated in all clinical situations.     eGFR's persistently <90 mL/min signify possible Chronic Kidney     Disease.     Performed at [facility]    Physical Findings: AIMS:  , ,  ,  ,    CIWA:  CIWA-Ar Total: 2 COWS:     Treatment Plan  Summary: Daily contact with patient to assess and evaluate symptoms and progress in treatment Medication management  Plan: 1. Continue crisis management and stabilization.  2. Medication management: Continue librium protocol for alcohol withdrawal, Trazodone 50 mg hs  prn insomnia.  3. Encouraged patient to attend groups and participate in group counseling sessions and activities.  4. Discharge plan in progress. Patient expresses interest in [facility] for further treatment of alcohol abuse. 5. Continue current treatment plan.  6. Address health issues: Vitals reviewed and stable. Reviewed recent metabolic profile that shows elevation in serum creatinine from 1.11 to 1.58. Will encourage patient to increase fluid intake.   Medical Decision Making Problem Points:  Established problem, stable/improving (1), Review of last therapy session (1) and Review of psycho-social stressors (1) Data Points:  Review or order clinical lab tests (1) Review of medication regiment & side effects (2)  I certify that inpatient services furnished can reasonably be expected to improve the patient's condition.   Elmarie Shiley NP-C 02/04/2014, 1:21 PM  Patient seen, evaluated and I agree with notes by Nurse Practitioner. Corena Pilgrim, MD

## 2014-02-04 NOTE — Progress Notes (Signed)
D: Pt has been appropriate on the unit today, he has attended all groups and has engaged in treatment. Pt refused all morning medications, he reported that he did not need the medication anymore and that the medication was just making him worst. Pt reported his depression as a 4, and his hopelessness as a 4. Pt reported being negative SI/HI, no AH/VH noted. A: 15 min checks continued for patient safety. R: Pt safety maintained.

## 2014-02-04 NOTE — Progress Notes (Signed)
D.  Pt pleasant on approach, denies complaints at this time.  Pt does state that he had difficulty getting up this morning and he feels that the reason for this was the medication he took last night.  Pt requests to not take the Trazodone for sleep tonight and only take his scheduled Librium.  Denies SI/HI/hallucinations at this time.  Positive for evening AA group, interacting appropriately within milieu.  A.  Support and encouragement offered   R.  Pt remains safe on unit, will continue to monitor.

## 2014-02-04 NOTE — Progress Notes (Signed)
Psychoeducational Group Note  Date:  02/04/2014 Time:  0945 am  Group Topic/Focus:  Making Healthy Choices:   The focus of this group is to help patients identify negative/unhealthy choices they were using prior to admission and identify positive/healthier coping strategies to replace them upon discharge.  Participation Level:  None  Participation Quality:  Drowsy  Affect:  Blunted  Cognitive:  Lacking  Insight:  Lacking  Engagement in Group:  Limited, Poor and Resistant  Additional Comments:  Video on chemical dependency and discussion  Andrena Mewsuttall, Jaedin Trumbo J 02/04/2014, 1:15 pm

## 2014-02-04 NOTE — [facility] Group Notes (Signed)
[facility] LCSW Group Therapy No    02/04/2014 10:00 AM  Type of Therapy and Topic:  Group Therapy: Stages of Change and Self-Sabotaging, Enabling Behaviors   Participation Level:  Active   Mood: Appropriate  Description of Group:     Learn how to identify obstacles, self-sabotaging and enabling behaviors, what are they, why do we do them and what needs do these behaviors meet? Discuss unhealthy relationships and how to have positive healthy boundaries with those that sabotage and enable. Explore aspects of self-sabotage and enabling in yourself and how to limit these self-destructive behaviors in everyday life.  Therapeutic Goals: 1. Patient will identify one obstacle that relates to self-sabotage and enabling behaviors 2. Patient will identify one personal self-sabotaging or enabling behavior they did prior to admission 3. Patient will demonstrate ability to communicate their needs through discussion and/or role plays. 4. Patient will identify where they are in Stages of Change Diagram   Summary of Patient Progress: The main focus of today's process group was to have patient identify with what "self-sabotage" means to them and use Motivational Interviewing to discuss what benefits, negative or positive, were involved in a self-identified self-sabotaging behavior. We then talked about reasons the patient may want to change the behavior and any current desire to change. Patients then had opportunity to identify where they are on the Stages of Change Cycle diagram discussed in group. [name] was open to sharing and shared his tendency to overthink things thus getting himself back into negative thinking, shame & guilt and relapse.  [name] shared that he feels he is in the Maintenance stage of change.    Therapeutic Modalities:   Cognitive Behavioral Therapy Person-Centered Therapy Motivational Interviewing   Carney Bernatherine C Harrill, LCSW

## 2014-02-04 NOTE — Progress Notes (Signed)
Patient did attend the evening speaker AA meeting.  

## 2014-02-04 NOTE — Progress Notes (Signed)
Adult Psychoeducational Group Note  Date:  02/04/2014 Time:  4:08 PM  Group Topic/Focus:  Making Healthy Choices:   The focus of this group is to help patients identify negative/unhealthy choices they were using prior to admission and identify positive/healthier coping strategies to replace them upon discharge.  Participation Level:  Did Not Attend  [name] 02/04/2014, 4:08 PM

## 2014-02-05 DIAGNOSIS — F411 Generalized anxiety disorder: Secondary | ICD-10-CM

## 2014-02-05 DIAGNOSIS — F321 Major depressive disorder, single episode, moderate: Secondary | ICD-10-CM

## 2014-02-05 NOTE — Progress Notes (Signed)
Adult Psychoeducational Group Note  Date:  02/05/2014 Time:  10:00 am  Group Topic/Focus:  Wellness Toolbox:   The focus of this group is to discuss various aspects of wellness, balancing those aspects and exploring ways to increase the ability to experience wellness.  Patients will create a wellness toolbox for use upon discharge.  Participation Level:  Active  Participation Quality:  Appropriate, Sharing and Supportive  Affect:  Appropriate  Cognitive:  Appropriate  Insight: Appropriate  Engagement in Group:  Engaged  Modes of Intervention:  Discussion, Education, Socialization and Support  Additional Comments:  Pt stated that he wants to be a public speaker and work with at risk youth. Pt stated that exercising is a way that he can promote health and wellness in his life. Pt stated that the barriers to his progress are his environment.   Kjell Brannen 02/05/2014, 3:26 PM

## 2014-02-05 NOTE — Progress Notes (Signed)
[facility]  02/05/2014 1:43 PM [name]  MRN:  [id number] Subjective:  [name] expresses having a lot of anxiety, worry. States that alcohol helps to not think about his problems. Without it he has to face them. He is having a lot of worries and concerns about his ability to stay sober. States that everyone drinks where he stays. He is going to court for a DWI in May. Endorses that he is having problems with his prothesis. It is not fitting  well for what he has a lot of pain in the stump.  Diagnosis:   DSM5: Schizophrenia Disorders:  none Obsessive-Compulsive Disorders:  none Trauma-Stressor Disorders:  none Substance/Addictive Disorders:  Alcohol Related Disorder - Severe (303.90) Depressive Disorders:  Major Depressive Disorder - Moderate (296.22) Total Time spent with patient: 30 minutes  Axis I: Anxiety Disorder NOS  ADL's:  Intact  Sleep: Fair  Appetite:  Fair  Suicidal Ideation:  Plan:  denies Intent:  denies Means:  denies Homicidal Ideation:  Plan:  denies Intent:  denies Means:  denies AEB (as evidenced by):  Psychiatric Specialty Exam: Physical Exam  Review of Systems  Constitutional: Positive for malaise/fatigue.  HENT: Negative.   Eyes: Negative.   Respiratory: Negative.   Cardiovascular: Negative.   Gastrointestinal: Negative.   Genitourinary: Negative.   Musculoskeletal: Positive for back pain and joint pain.       Pain in the stump due to ill fitting prothesis  Skin: Negative.   Neurological: Positive for weakness.  Endo/Heme/Allergies: Negative.   Psychiatric/Behavioral: Positive for depression and substance abuse. The patient is nervous/anxious.     Blood pressure 131/88, pulse 80, temperature 97.2 F (36.2 C), temperature source Oral, resp. rate 18, height 5\' 11"  (1.803 m), weight 88.451 kg (195 lb), SpO2 98.00%.Body mass index is 27.21 kg/(m^2).  General Appearance: Fairly Groomed  Patent attorneyye Contact::  Fair  Speech:  Clear and  Coherent  Volume:  Decreased  Mood:  Anxious, Depressed and worried  Affect:  anxious, worried  Thought Process:  Coherent and Goal Directed  Orientation:  Full (Time, Place, and Person)  Thought Content:  symtpoms, worries concerns  Suicidal Thoughts:  No  Homicidal Thoughts:  No  Memory:  Immediate;   Fair Recent;   Fair Remote;   Fair  Judgement:  Fair  Insight:  Present  Psychomotor Activity:  Restlessness  Concentration:  Fair  Recall:  FiservFair  Fund of Knowledge:Fair  Language: Fair  Akathisia:  No  Handed:    AIMS (if indicated):     Assets:  Desire for Improvement  Sleep:  Number of Hours: 6   Musculoskeletal: Strength & Muscle Tone: within normal limits Gait & Station: below knee amputee left leg Patient leans: N/A  Current Medications: Current Facility-Administered Medications  Medication Dose Route Frequency Provider Last Rate Last Dose  . acetaminophen (TYLENOL) tablet 650 mg  650 mg Oral Q6H PRN Kristeen MansFran E Hobson, NP   650 mg at 02/04/14 2241  . alum & mag hydroxide-simeth (MAALOX/MYLANTA) 200-200-20 MG/5ML suspension 30 mL  30 mL Oral Q4H PRN Kristeen MansFran E Hobson, NP      . chlordiazePOXIDE (LIBRIUM) capsule 25 mg  25 mg Oral BH-qamhs Kristeen MansFran E Hobson, NP   25 mg at 02/04/14 2239   Followed by  . [START ON 02/06/2014] chlordiazePOXIDE (LIBRIUM) capsule 25 mg  25 mg Oral Daily Kristeen MansFran E Hobson, NP      . magnesium hydroxide (MILK OF MAGNESIA) suspension 30 mL  30 mL  Oral Daily PRN Kristeen MansFran E Hobson, NP      . multivitamin with minerals tablet 1 tablet  1 tablet Oral Daily Kristeen MansFran E Hobson, NP   1 tablet at 02/05/14 0753  . thiamine (B-1) injection 100 mg  100 mg Intramuscular Once Kristeen MansFran E Hobson, NP      . thiamine (VITAMIN B-1) tablet 100 mg  100 mg Oral Daily Kristeen MansFran E Hobson, NP   100 mg at 02/05/14 0753  . traZODone (DESYREL) tablet 50 mg  50 mg Oral QHS PRN Kristeen MansFran E Hobson, NP   50 mg at 02/03/14 2229    Lab Results: No results found for this or any previous visit (from the past 48  hour(s)).  Physical Findings: AIMS: Facial and Oral Movements Muscles of Facial Expression: None, normal Lips and Perioral Area: None, normal Jaw: None, normal Tongue: None, normal,Extremity Movements Upper (arms, wrists, hands, fingers): None, normal Lower (legs, knees, ankles, toes): None, normal, Trunk Movements Neck, shoulders, hips: None, normal, Overall Severity Severity of abnormal movements (highest score from questions above): None, normal Incapacitation due to abnormal movements: None, normal Patient's awareness of abnormal movements (rate only patient's report): No Awareness, Dental Status Current problems with teeth and/or dentures?: No Does patient usually wear dentures?: No  CIWA:  CIWA-Ar Total: 0 COWS:     Treatment Plan Summary: Daily contact with patient to assess and evaluate symptoms and progress in treatment Medication management  Plan: Supportive approach/copig skills/relapse prevention            Continue detox (BP 131/88 )            CBT;mindfulness            PT evaluation            Facilitate admission to a rehab program              Medical Decision Making Problem Points:  Review of psycho-social stressors (1) Data Points:  Review of medication regiment & side effects (2)  I certify that inpatient services furnished can reasonably be expected to improve the patient's condition.   Rachael Feerving A Daphanie Oquendo 02/05/2014, 1:43 PM

## 2014-02-05 NOTE — Progress Notes (Signed)
D: Patient in the hallway on approach.  Patient states he had a good day.  Patient states he feels that he is ready for discharge.  Patient states the most valuable thing he has realized is " you can't keep doing the same thing and expect different results."  Patient states he realizes he needs to stop drinking now while he is young.  Patient denies SI/HI and denies SI/HI and denies AVH. A: Staff to monitor Q 15 mins for safety.  Encouragement and support offered.  Scheduled medications administered per orders. R: Patient remains safe on the unit.  Patient attended group tonight. Patient visible on the unit and interacting with peers.  Patient taking administered medications.

## 2014-02-05 NOTE — Progress Notes (Signed)
Adult Psychoeducational Group Note  Date:  02/05/2014 Time:  9:35 PM  Group Topic/Focus:  Wrap-Up Group:   The focus of this group is to help patients review their daily goal of treatment and discuss progress on daily workbooks.  Participation Level:  Active  Participation Quality:  Appropriate  Affect:  Appropriate  Cognitive:  Appropriate  Insight: Appropriate  Engagement in Group:  Engaged  Modes of Intervention:  Support  Additional Comments:  Pt stated that positive thing that happened today was that he was able to have positive interactions with his peers and to interact with strangers and to be around new ppl, is something he really enjoyed.   [name] 02/05/2014, 9:35 PM

## 2014-02-05 NOTE — Progress Notes (Signed)
D:  Per pt self inventory pt reports sleeping fair, appetite improving, energy level high, ability to pay attention good, rates depression at a 3 out of 10 and hopelessness at a 3 out of 10, denies SI/HI/AVH.      A:  Emotional support provided, Encouraged pt to continue with treatment plan and attend all group activities, q15 min checks maintained for safety.  R:  Pt is receptive, calm and cooperative, pleasant with staff and other patients.
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
## 2014-02-05 NOTE — [facility] Group Notes (Signed)
[facility]   02/05/2014 11:12 AM  Participation Quality:  Appropriate   Mood/Affect:  Appropriate  Depression Rating:  0  Anxiety Rating:  0  Thoughts of Suicide:  No Will you contract for safety?   NA  Current AVH:  No  Plan for Discharge/Comments:  Pt hoping for [facility] admission tomorrow. Spoke with Melissa. [facility]   Transportation Means: [facility]?  Supports: family supports identified   Berkshire HathawayHeather Smart LCSWA

## 2014-02-06 MED ORDER — HYDROXYZINE HCL 25 MG PO TABS
25.0000 mg | ORAL_TABLET | Freq: Every evening | ORAL | Status: DC | PRN
  Administered 2014-02-06 – 2014-02-07 (×2): 25 mg via ORAL
  Filled 2014-02-06 (×2): qty 1

## 2014-02-06 NOTE — Progress Notes (Signed)
D: Pt denies SI/HI/AVH. Pt is pleasant and cooperative. Pt was up walking with cane. Pt was noticed to be unsteady and informed to use the wheelchair and not to be walking around with the cane. Pt wanted to use vistaril for sleep due to trazodone causing nightmares.   A: Pt was offered support and encouragement. Pt was given scheduled medications. Pt was encourage to attend groups. Q 15 minute checks were done for safety.   R:Pt attends groups and interacts well with peers and staff. Pt is taking medication. Pt receptive to treatment and safety maintained on unit. Pt acknowledged verbal understanding of not to ambulate with cane.

## 2014-02-06 NOTE — [facility] Group Notes (Signed)
Adult Psychoeducational Group Note  Date:  02/06/2014 Time:  10:30 PM  Group Topic/Focus:  AA Meeting  Participation Level:  Active  Participation Quality:  Appropriate  Affect:  Appropriate  Cognitive:  Appropriate  Insight: Appropriate  Engagement in Group:  Engaged  Modes of Intervention:  Discussion and Education  Additional Comments:  [name] attended AA group.  Deeana Atwater A Anshika Pethtel 02/06/2014, 10:30 PM

## 2014-02-06 NOTE — [facility] Group Notes (Signed)
[facility] LCSW Group Therapy  02/06/2014 3:08 PM  Type of Therapy:  Group Therapy  Participation Level:  Did Not Attend [name] assisted CSW with moving chairs into room for group but was called out by nurse. He came in for a few minutes and was again pulled out of group by a staff member. He did not return.   Troi Bechtold Smart LCSWA  02/06/2014, 3:08 PM

## 2014-02-06 NOTE — Progress Notes (Signed)
[facility]  02/06/2014 11:31 AM [name]  MRN:  [id number] Subjective:  [name] is anticipating being able to go into [facility]. States he needs the extra help. States he is finally getting at a stage in which he feels some hope. Concerned that if he was not able to go into rehab he could loose the momentum and relapse. He is dealing with gried, losing his leg and when waking up from surgery being told that his mother had just died while he was in surgery. Admits to unresolved grief that he is starting to address. Refers to his tattoo in memory of his mother that says "pain never goes away" He is still dealing with both pains. Does not want to take antidepressants at this time. Wants to do the work in therapy, sober Diagnosis:   DSM5: Schizophrenia Disorders:  none Obsessive-Compulsive Disorders:  none Trauma-Stressor Disorders:  none Substance/Addictive Disorders:  Alcohol Related Disorder - Severe (303.90) Depressive Disorders:  Major Depressive Disorder - Moderate (296.22) Total Time spent with patient: 30 minutes  Axis I: Substance Induced Mood Disorder  ADL's:  Intact  Sleep: Fair  Appetite:  Fair  Suicidal Ideation:  Plan:  denies Intent:  denies Means:  denies Homicidal Ideation:  Plan:  denies Intent:  denies Means:  denies AEB (as evidenced by):  Psychiatric Specialty Exam: Physical Exam  Review of Systems  Constitutional: Negative.   HENT: Negative.   Eyes: Negative.   Respiratory: Negative.   Cardiovascular: Negative.   Gastrointestinal: Negative.   Genitourinary: Negative.   Musculoskeletal: Positive for back pain and joint pain.  Skin: Negative.   Neurological: Negative.   Endo/Heme/Allergies: Negative.   Psychiatric/Behavioral: Positive for depression and substance abuse. The patient is nervous/anxious.     Blood pressure 144/84, pulse 71, temperature 97.5 F (36.4 C), temperature source Oral, resp. rate 20, height 5\' 11"  (1.803 m), weight  88.451 kg (195 lb), SpO2 98.00%.Body mass index is 27.21 kg/(m^2).  General Appearance: Fairly Groomed  Patent attorneyye Contact::  Fair  Speech:  Clear and Coherent  Volume:  Decreased  Mood:  Anxious and sad  Affect:  Restricted  Thought Process:  Coherent and Goal Directed  Orientation:  Full (Time, Place, and Person)  Thought Content:  symptoms, worries, concerns, losses  Suicidal Thoughts:  No  Homicidal Thoughts:  No  Memory:  Immediate;   Fair Recent;   Fair Remote;   Fair  Judgement:  Fair  Insight:  Present  Psychomotor Activity:  Restlessness  Concentration:  Fair  Recall:  FiservFair  Fund of Knowledge:NA  Language: Fair  Akathisia:  No  Handed:    AIMS (if indicated):     Assets:  Desire for Improvement  Sleep:  Number of Hours: 6   Musculoskeletal: Strength & Muscle Tone: within normal limits Gait & Station: normal Patient leans: N/A  Current Medications: Current Facility-Administered Medications  Medication Dose Route Frequency Provider Last Rate Last Dose  . acetaminophen (TYLENOL) tablet 650 mg  650 mg Oral Q6H PRN Kristeen MansFran E Hobson, NP   650 mg at 02/05/14 2235  . alum & mag hydroxide-simeth (MAALOX/MYLANTA) 200-200-20 MG/5ML suspension 30 mL  30 mL Oral Q4H PRN Kristeen MansFran E Hobson, NP      . chlordiazePOXIDE (LIBRIUM) capsule 25 mg  25 mg Oral Daily Kristeen MansFran E Hobson, NP      . magnesium hydroxide (MILK OF MAGNESIA) suspension 30 mL  30 mL Oral Daily PRN Kristeen MansFran E Hobson, NP      .  multivitamin with minerals tablet 1 tablet  1 tablet Oral Daily Kristeen MansFran E Hobson, NP   1 tablet at 02/06/14 0750  . thiamine (B-1) injection 100 mg  100 mg Intramuscular Once Kristeen MansFran E Hobson, NP      . thiamine (VITAMIN B-1) tablet 100 mg  100 mg Oral Daily Kristeen MansFran E Hobson, NP   100 mg at 02/06/14 0750  . traZODone (DESYREL) tablet 50 mg  50 mg Oral QHS PRN Kristeen MansFran E Hobson, NP   50 mg at 02/05/14 2235    Lab Results: No results found for this or any previous visit (from the past 48 hour(s)).  Physical Findings: AIMS:  Facial and Oral Movements Muscles of Facial Expression: None, normal Lips and Perioral Area: None, normal Jaw: None, normal Tongue: None, normal,Extremity Movements Upper (arms, wrists, hands, fingers): None, normal Lower (legs, knees, ankles, toes): None, normal, Trunk Movements Neck, shoulders, hips: None, normal, Overall Severity Severity of abnormal movements (highest score from questions above): None, normal Incapacitation due to abnormal movements: None, normal Patient's awareness of abnormal movements (rate only patient's report): No Awareness, Dental Status Current problems with teeth and/or dentures?: No Does patient usually wear dentures?: No  CIWA:  CIWA-Ar Total: 0 COWS:     Treatment Plan Summary: Daily contact with patient to assess and evaluate symptoms and progress in treatment Medication management  Plan: Supportive approach/coping skills/grief-loss           [facility]  Medical Decision Making Problem Points:  Review of psycho-social stressors (1) Data Points:  Review of medication regiment & side effects (2)  I certify that inpatient services furnished can reasonably be expected to improve the patient's condition.   Rachael Feerving A Alisen Marsiglia 02/06/2014, 11:31 AM

## 2014-02-06 NOTE — Progress Notes (Signed)
Patient ID: [name], male   DOB: 07-24-1981, 33 y.o.   MRN: [id number] D: patient has been up in the milieu.  He has been attending groups and participating in his treatment.  He denies any depressive symptoms or SI.  He denies any HI/AVH.  Patient declined his librium stating he has not had any withdrawal symptoms.  [facility]  Patient has been interacting well with staff and peers.  A: continue to monitor medication management and MD orders.  Safety checks completed every 15 minutes per protocol.  R: patient is receptive to staff; his behavior has been appropriate.

## 2014-02-06 NOTE — Progress Notes (Signed)
Recreation Therapy Notes  Animal-Assisted Activity/Therapy (AAA/T) Program Checklist/Progress Notes Patient Eligibility Criteria Checklist & Daily Group note for Rec Tx Intervention  Date: 04.14.2015 Time: 2:45am [address]    AAA/T Program Assumption of Risk Form signed by Patient/ or Parent Legal Guardian yes  Patient is free of allergies or sever asthma yes  Patient reports no fear of animals yes  Patient reports no history of cruelty to animals yes   Patient understands his/her participation is voluntary yes  Patient washes hands before animal contact yes  Patient washes hands after animal contact yes  Behavioral Response: Engaged, Appropriate   Education: Charity fundraiserHand Washing, Appropriate Animal Interaction   Education Outcome: Acknowledges understanding  Clinical Observations/Feedback: Patient interacted appropriately with therapy dog team. Patient shared stories about animals he has had in the past.   Jearl Klinefelterenise L Mcclellan Demarais, LRT/CTRS  Jearl KlinefelterDenise L Berton Butrick 02/06/2014 4:46 PM

## 2014-02-07 NOTE — Progress Notes (Signed)
Pt reports his sleep as poor even after taking medication.  His appetite is good energy and ability to pay attention as good.  He rated his his depression a 1 hopelessness 1 and anxiety 1 on his self-inventory.  He denies any S/H ideation or A/V/H. [facility].

## 2014-02-07 NOTE — Progress Notes (Signed)
[facility]  02/07/2014 5:15 PM [name]  MRN:  [id number] Subjective:  Continues to work on trying to get his life back together. States that he needs to go to rehab. He is concerned about relapsing and getting back to the same situation he was before. States that he wants to have a sense of purpose for his life. He wants to be an inspiration to other people given what he has been trough and be and advocate for peace Diagnosis:   DSM5: Schizophrenia Disorders:  none Obsessive-Compulsive Disorders:  none Trauma-Stressor Disorders:  none Substance/Addictive Disorders:  Alcohol Related Disorder - Severe (303.90) Depressive Disorders:  Major Depressive Disorder - Moderate (296.22) Total Time spent with patient: 30 minutes  Axis I: Substance Induced Mood Disorder  ADL's:  Intact  Sleep: Fair  Appetite:  Fair  Suicidal Ideation:  Plan:  denies Intent:  denies Means:  denies Homicidal Ideation:  Plan:  denies Intent:  denies Means:  denies AEB (as evidenced by):  Psychiatric Specialty Exam: Physical Exam  Review of Systems  Constitutional: Negative.   HENT: Negative.   Eyes: Negative.   Respiratory: Negative.   Cardiovascular: Negative.   Gastrointestinal: Negative.   Genitourinary: Negative.   Musculoskeletal: Positive for back pain.  Skin: Negative.   Endo/Heme/Allergies: Negative.   Psychiatric/Behavioral: Positive for substance abuse. The patient is nervous/anxious.     Blood pressure 124/81, pulse 61, temperature 97.9 F (36.6 C), temperature source Oral, resp. rate 16, height 5\' 11"  (1.803 m), weight 88.451 kg (195 lb), SpO2 98.00%.Body mass index is 27.21 kg/(m^2).  General Appearance: Fairly Groomed  Patent attorneyye Contact::  Fair  Speech:  Clear and Coherent  Volume:  Decreased  Mood:  worried  Affect:  worried  Thought Process:  Coherent and Goal Directed  Orientation:  Full (Time, Place, and Person)  Thought Content:  worries, concerns plans for  himself  Suicidal Thoughts:  No  Homicidal Thoughts:  No  Memory:  Immediate;   Fair Recent;   Fair Remote;   Fair  Judgement:  Fair  Insight:  Present  Psychomotor Activity:  Normal  Concentration:  Fair  Recall:  FiservFair  Fund of Knowledge:NA  Language: Fair  Akathisia:  No  Handed:    AIMS (if indicated):     Assets:  Desire for Improvement Resilience  Sleep:  Number of Hours: 6   Musculoskeletal: Strength & Muscle Tone: within normal limits Gait & Station: unsteady Patient leans: N/A  Current Medications: Current Facility-Administered Medications  Medication Dose Route Frequency Provider Last Rate Last Dose  . acetaminophen (TYLENOL) tablet 650 mg  650 mg Oral Q6H PRN Kristeen MansFran E Hobson, NP   650 mg at 02/06/14 2222  . alum & mag hydroxide-simeth (MAALOX/MYLANTA) 200-200-20 MG/5ML suspension 30 mL  30 mL Oral Q4H PRN Kristeen MansFran E Hobson, NP      . hydrOXYzine (ATARAX/VISTARIL) tablet 25 mg  25 mg Oral QHS PRN Sanjuana KavaAgnes I Nwoko, NP   25 mg at 02/06/14 2220  . magnesium hydroxide (MILK OF MAGNESIA) suspension 30 mL  30 mL Oral Daily PRN Kristeen MansFran E Hobson, NP      . multivitamin with minerals tablet 1 tablet  1 tablet Oral Daily Kristeen MansFran E Hobson, NP   1 tablet at 02/07/14 [phone]  . thiamine (B-1) injection 100 mg  100 mg Intramuscular Once Kristeen MansFran E Hobson, NP      . thiamine (VITAMIN B-1) tablet 100 mg  100 mg Oral Daily Kristeen MansFran E Hobson, NP  100 mg at 02/07/14 [id number]    Lab Results: No results found for this or any previous visit (from the past 48 hour(s)).  Physical Findings: AIMS: Facial and Oral Movements Muscles of Facial Expression: None, normal Lips and Perioral Area: None, normal Jaw: None, normal Tongue: None, normal,Extremity Movements Upper (arms, wrists, hands, fingers): None, normal Lower (legs, knees, ankles, toes): None, normal, Trunk Movements Neck, shoulders, hips: None, normal, Overall Severity Severity of abnormal movements (highest score from questions above): None,  normal Incapacitation due to abnormal movements: None, normal Patient's awareness of abnormal movements (rate only patient's report): No Awareness, Dental Status Current problems with teeth and/or dentures?: No Does patient usually wear dentures?: No  CIWA:  CIWA-Ar Total: 0 COWS:     Treatment Plan Summary: Daily contact with patient to assess and evaluate symptoms and progress in treatment Medication management  Plan: Supportive approach/coping skills/relapse prevention           Reassess and address the co morbidities  Medical Decision Making Problem Points:  Review of psycho-social stressors (1) Data Points:  Review of medication regiment & side effects (2)  I certify that inpatient services furnished can reasonably be expected to improve the patient's condition.   Rachael Feerving [name] 02/07/2014, 5:15 PM

## 2014-02-07 NOTE — Progress Notes (Signed)
D: Patient in the hallway on first approach.  Patient states he had a good day.  Patient states he is anxious about discharge.  Patient appears animated when speaking with Clinical research associatewriter.  Patient denies SI/HI and denies AVH. A: Staff to monitor Q 15 mins for safety.  Encouragement and support offered.  Scheduled medications administered per orders.  Tylenol administered prn   Vistaril administered prn for sleep. R: Patient remains safe on the unit.  Patient attended group tonight.  Patient taking administered medications.  Patient visible on the unit and interacting with peers.

## 2014-02-07 NOTE — [facility] Group Notes (Signed)
[facility] LCSW Group Therapy  02/07/2014 3:07 PM  Type of Therapy:  Group Therapy  Participation Level:  Active  Participation Quality:  Attentive  Affect:  Appropriate  Cognitive:  Alert and Oriented  Insight:  Engaged  Engagement in Therapy:  Engaged  Modes of Intervention:  Confrontation, Discussion, Education, Exploration, Problem-solving, Rapport Building, Socialization and Support  Summary of Progress/Problems: Emotion Regulation: This group focused on both positive and negative emotion identification and allowed group members to process ways to identify feelings, regulate negative emotions, and find healthy ways to manage internal/external emotions. Group members were asked to reflect on a time when their reaction to an emotion led to a negative outcome and explored how alternative responses using emotion regulation would have benefited them. Group members were also asked to discuss a time when emotion regulation was utilized when a negative emotion was experienced. [name] was attentive and engaged throughout today's therapy group. He shared that he struggles with guilt and shame. [name] assisted other pts in understanding/utilizing skills to regulate anger. "I went to jail 3x and learned patience that way. That's what I needed. Now, I realize that I'm an adult and I have control over my reactions and my emotions, not anyone else." [name] shows progress in the group setting and improving insight AEB his ability to process how [facility] and seeking treatment is a step in the right direction, thereby decreasing his feelings of shame and guilt regarding latest relapse.    Merian Wroe Smart LCSWA  02/07/2014, 3:07 PM

## 2014-02-07 NOTE — Progress Notes (Signed)
Attended group 

## 2014-02-07 NOTE — [facility] Group Notes (Signed)
[facility] Group Note   02/07/2014 11:14 AM  Participation Quality:  Appropriate   Mood/Affect:  Appropriate  Depression Rating:  2  Anxiety Rating:  0  Thoughts of Suicide:  No Will you contract for safety?   NA  Current AVH:  No  Plan for Discharge/Comments:  Pt reports [facility] in attempt to figure out if [facility]. CSW contacted [facility]-they are still working on this and will call back later to let us know status.   Transportation Means: [facility] transport if accepted.   Supports: family members identified as supports   Teaching laboratory technicianHeather Smart LCSWA

## 2014-02-07 NOTE — Tx Team (Signed)
Interdisciplinary Treatment Plan Update (Adult)  Date: 02/07/2014   Time Reviewed: 11:16 AM  Progress in Treatment:  Attending groups: Yes  Participating in groups:  Yes  Taking medication as prescribed: Yes  Tolerating medication: Yes  Family/Significant othe contact made: Attempts made with pt's sister. SPE completed with pt.  Patient understands diagnosis: Yes, AEB seeking treatment for ETOH detox, marijuana use, passive SI/depression, and medication management.  Discussing patient identified problems/goals with staff: Yes  Medical problems stabilized or resolved: Yes  Denies suicidal/homicidal ideation: Yes during group/self report.  Patient has not harmed self or Others: Yes  New problem(s) identified:  Discharge Plan or Barriers: [facility] accepted pt but are trying to determine if pt's medicaid is [place] or [place]-pt recently attempted to transfer services. [facility] working with CSW and pt to figure this out. Hopefully, pt will be accepted today or tomorrow into program.  Additional comments:       Reason for Continuation of Hospitalization: Mood stabilization Estimated length of stay: 1 day  For review of initial/current patient goals, please see plan of care.  Attendees:  Patient:    Family:    Physician: Geoffery LyonsIrving Lugo MD 02/07/2014 11:16 AM   Nursing: Darden DatesJennifer C. Nurse CM  02/07/2014 11:16 AM   Clinical Social Worker Berdella Bacot Smart, LCSWA  02/07/2014 11:16 AM   Other: Maureen RalphsVivian RN 02/07/2014 11:18 AM   Other: Christa RN 02/07/2014 11:18 AM   Other Aggie N. PA 02/07/2014 11:18 AM   Other: Delora Case Coordinator 02/07/2014 11:18 AM   Scribe for Treatment Team:  Herbert SetaHeather Smart LCSWA 02/07/2014 11:16 AM

## 2014-02-08 MED ORDER — HYDROXYZINE HCL 25 MG PO TABS: ORAL_TABLET | ORAL | Status: AC

## 2014-02-08 MED ORDER — HYDROXYZINE HCL 25 MG PO TABS
25.0000 mg | ORAL_TABLET | Freq: Two times a day (BID) | ORAL | Status: DC | PRN
  Filled 2014-02-08: qty 28

## 2014-02-08 NOTE — Progress Notes (Signed)
Recreation Therapy Notes Animal-Assisted Activity/Therapy (AAA/T) Program Checklist/Progress Notes Patient Eligibility Criteria Checklist & Daily Group note for Rec Tx Intervention  Date: 04.16.2015 Time: 2:45pm [address]   AAA/T Program Assumption of Risk Form signed by Patient/ or Parent Legal Guardian yes  Patient is free of allergies or sever asthma yes  Patient reports no fear of animals yes  Patient reports no history of cruelty to animals yes   Patient understands his/her participation is voluntary yes  Patient washes hands before animal contact yes  Patient washes hands after animal contact yes  Behavioral Response: Appropriate   Education: Hand Washing, Appropriate Animal Interaction   Education Outcome: Acknowledges understanding  Clinical Observations/Feedback: Patient interacted appropriately with therapeutic dog team, peers and LRT.   Marykay Lexenise L Tashon Capp, LRT/CTRS  Fate Caster L Correy Weidner 02/08/2014 4:09 PM

## 2014-02-08 NOTE — [facility] Suicide Risk Assessment (Signed)
Suicide Risk Assessment  Discharge Assessment     Demographic Factors:  Male  Total Time spent with patient: 45 minutes  Psychiatric Specialty Exam:     Blood pressure 129/66, pulse 86, temperature 97.9 F (36.6 C), temperature source Oral, resp. rate 20, height 5\' 11"  (1.803 m), weight 88.451 kg (195 lb), SpO2 98.00%.Body mass index is 27.21 kg/(m^2).  General Appearance: Fairly Groomed  Patent attorneyye Contact::  Fair  Speech:  Clear and Coherent  Volume:  Normal  Mood:  worried  Affect:  Appropriate  Thought Process:  Coherent and Goal Directed  Orientation:  Full (Time, Place, and Person)  Thought Content:  his relapse prevention plan, attemtps to get into [facility]  Suicidal Thoughts:  No  Homicidal Thoughts:  No  Memory:  Immediate;   Fair Recent;   Fair Remote;   Poor  Judgement:  Fair  Insight:  Present  Psychomotor Activity:  Normal  Concentration:  Fair  Recall:  FiservFair  Fund of Knowledge:NA  Language: Fair  Akathisia:  No  Handed:    AIMS (if indicated):     Assets:  Desire for Improvement Resilience  Sleep:  Number of Hours: 45    Musculoskeletal: Strength & Muscle Tone: within normal limits Gait & Station: unsteady prothesis Patient leans: N/A   Mental Status Per Nursing Assessment::   On Admission:   (denies)  Current Mental Status by Physician: In full contact with reality. There are no active S/S of withdrawal. No active suicidal ideas plans or intent. Mood is euthymic, affect is appropriate   Loss Factors: NA  Historical Factors: NA  Risk Reduction Factors:   Positive coping skills or problem solving skills, sense of responsibility to his family  Continued Clinical Symptoms:  Alcohol/Substance Abuse/Dependencies  Cognitive Features That Contribute To Risk: None identified   Suicide Risk:  Minimal: No identifiable suicidal ideation.  Patients presenting with no risk factors but with morbid ruminations; may be classified as minimal risk based on the  severity of the depressive symptoms  Discharge Diagnoses:   AXIS I:  Alcohol Dependence, Substance Induced mood disorder AXIS II:  Deferred AXIS III:   Past Medical History  Diagnosis Date  . Eye problem 02/01/2014    Foreign object in left eye, about 2 months ago.   AXIS IV:  other psychosocial or environmental problems AXIS V:  61-70 mild symptoms  Plan Of Care/Follow-up recommendations:  Activity:  as tolerated Diet:  regular Follow up [facility] Is patient on multiple antipsychotic therapies at discharge:  No   Has Patient had three or more failed trials of antipsychotic monotherapy by history:  No  Recommended Plan for Multiple Antipsychotic Therapies: NA    Rachael FeeIrving A Brielle Moro 02/08/2014, 2:03 PM

## 2014-02-08 NOTE — Discharge Summary (Signed)
Physician Discharge Summary Note  Patient:  [name] is an 33 y.o., male MRN:  [id number] DOB:  Sep 11, 1981 Patient phone:  [phone] (home)  Patient address:   [address] [place] [address],  Total Time spent with patient: Greater than 30 minutes  Date of Admission:  02/01/2014 Date of Discharge: 02/08/14  Reason for Admission: Alcohol detox treatment  Discharge Diagnoses: Active Problems:   Alcohol dependence   Substance induced mood disorder  Psychiatric Specialty Exam: Physical Exam  Psychiatric: His speech is normal and behavior is normal. Judgment and thought content normal. His mood appears not anxious. His affect is not angry, not blunt, not labile and not inappropriate. Cognition and memory are normal. He does not exhibit a depressed mood.    Review of Systems  Unable to perform ROS HENT: Negative.   Eyes: Negative.   Respiratory: Negative.   Cardiovascular: Negative.   Gastrointestinal: Negative.   Genitourinary: Negative.   Musculoskeletal: Negative.        Prosthesis, left leg  Skin: Negative.   Neurological: Negative.   Endo/Heme/Allergies: Negative.   Psychiatric/Behavioral: Positive for substance abuse (Alcoholism, chronic). Negative for depression, suicidal ideas, hallucinations and memory loss. The patient is not nervous/anxious and does not have insomnia.     Blood pressure 129/66, pulse 86, temperature 97.9 F (36.6 C), temperature source Oral, resp. rate 20, height 5\' 11"  (1.803 m), weight 88.451 kg (195 lb), SpO2 98.00%.Body mass index is 27.21 kg/(m^2).   General Appearance: Fairly Groomed   Patent attorney:: Fair   Speech: Clear and Coherent   Volume: Normal   Mood: worried   Affect: Appropriate   Thought Process: Coherent and Goal Directed   Orientation: Full (Time, Place, and Person)   Thought Content: his relapse prevention plan, attemtps to get into [facility]   Suicidal Thoughts: No   Homicidal Thoughts: No   Memory: Immediate; Fair   Recent; Fair  Remote; Poor   Judgement: Fair   Insight: Present   Psychomotor Activity: Normal   Concentration: Fair   Recall: [employer] of Knowledge:NA   Language: Fair   Akathisia: No   Handed:   AIMS (if indicated):   Assets: Desire for Improvement  Resilience   Sleep: Number of Hours: 45    Past Psychiatric History: Diagnosis: Substance induced mood disorder  Hospitalizations: [facility] adult unit  Outpatient Care: [facility] for medical issues  Substance Abuse Care: [facility] referral  Self-Mutilation: NA  Suicidal Attempts: NA  Violent Behaviors: NA   Musculoskeletal: Strength & Muscle Tone: within normal limits Gait & Station: Steady, however, uses left leg prosthesis Patient leans: Left  DSM5: Schizophrenia Disorders:  NA Obsessive-Compulsive Disorders:  NA Trauma-Stressor Disorders:  NA Substance/Addictive Disorders:  Alcohol Related Disorder - Severe (303.90) Depressive Disorders:  Substance induced mood disorder  Axis Diagnosis:  AXIS I:  Alcohol dependence, Substance induced mood disorder AXIS II:  Deferred AXIS III:   Past Medical History  Diagnosis Date  . Eye problem 02/01/2014    Foreign object in left eye, about 2 months ago.   AXIS IV:  other psychosocial or environmental problems and Alcoholism, chronic AXIS V:  62  Level of Care:  OP, [facility] referral  Hospital Course:  "My uncle took me to [facility] 2 days ago. I was drinking too much alcohol, feeling very depressed and became suicidal. My depression has worsened in the last 2 months. My drinking too much is associated with the environment that I live  in. Everyone I know drinks too much, family, friends. My drinking has worsened in the last 3 years as well.  [name] was admitted to the hospital with a blood alcohol level of 174. He was also presenting with suicidal ideation associated with deep depression. He was also intoxicated requiring detox treatment as well as mood stabilization. He  received Librium detoxification treatment protocols. [name] presented no other serious medical issues except that he needed a new evaluation on his left leg prosthesis and was referred to the [facility] here in [place], [place]. Besides the detox treatment, [name] received Hydroxyzine 25 mg on a prn basis for anxiety symptoms. He was also enrolled in the group counseling sessions and AA/NA meetings being offered on this unit. He learned coping skills.  [name] has completed detox treatment and his mood stabilized. He is currently being discharged to continue psychiatric care on an outpatient basis at the [facility] here [place], [place]. And for substance abuse treatment, [name] has been referred to [facility] treatment center in [place], [place]. He has been provided with all the pertinent information required to make these appointments without problems. Upon discharge, [name] adamantly denies any SIHI, AVH, delusions, paranoia and or withdrawal symptoms. [facility] with all personal belongings in no distress. Transportation per family.   Consults:  psychiatry  Significant Diagnostic Studies:  labs: CBC with diff, CMP, UDS, toxicology tests, U/A  Discharge Vitals:   Blood pressure 129/66, pulse 86, temperature 97.9 F (36.6 C), temperature source Oral, resp. rate 20, height 5\' 11"  (1.803 m), weight 88.451 kg (195 lb), SpO2 98.00%. Body mass index is 27.21 kg/(m^2). Lab Results:   No results found for this or any previous visit (from the past 72 hour(s)).  Physical Findings: AIMS: Facial and Oral Movements Muscles of Facial Expression: None, normal Lips and Perioral Area: None, normal Jaw: None, normal Tongue: None, normal,Extremity Movements Upper (arms, wrists, hands, fingers): None, normal Lower (legs, knees, ankles, toes): None, normal, Trunk Movements Neck, shoulders, hips: None, normal, Overall Severity Severity of abnormal movements (highest score from questions above):  None, normal Incapacitation due to abnormal movements: None, normal Patient's awareness of abnormal movements (rate only patient's report): No Awareness, Dental Status Current problems with teeth and/or dentures?: No Does patient usually wear dentures?: No  CIWA:  CIWA-Ar Total: 0 COWS:     Psychiatric Specialty Exam: See Psychiatric Specialty Exam and Suicide Risk Assessment completed by Attending Physician prior to discharge.  Discharge destination:  Other:  Home, with [facility] referral  Is patient on multiple antipsychotic therapies at discharge:  No   Has Patient had three or more failed trials of antipsychotic monotherapy by history:  No  Recommended Plan for Multiple Antipsychotic Therapies: NA       Future Appointments Provider Department Dept Phone   02/14/2014 11:30 AM Holland CommonsValerie Keck, NP [facility] [phone]       Medication List       Indication   hydrOXYzine 25 MG tablet  Commonly known as:  ATARAX/VISTARIL  Take 1 tablet (25 mg) twice daily as needed for anxiety/tension   Indication:  Tension, Anxiety       Follow-up Information   Follow up with [facility]. (referral made 4/10. Call daily to check bed availability. Melissa [phone]. )    Contact information:   [address]  [place], [place] [address] Phone: [phone] Fax: [phone]      [facility] with [facility]  On 02/19/2014. (Appt at 11:30AM. If you go into treatment at [facility], please call to reschedule this appt. )    Contact information:   [address] [place], [place] [address] Phone: [phone] Fax: [phone]      Follow up with [facility]. (Arrive between 8am-9am Monday through Friday for hospital followup/medication management/assessment for therapy services. )    Contact information:   [address] [place], [place] [address] Phone: [phone] Fax: [phone]     Follow-up recommendations: Activity:  As tolerated Diet: As  recommended by your primary care doctor. Keep all scheduled follow-up appointments as recommended.   Comments: Take all your medications as prescribed by your mental healthcare provider. Report any adverse effects and or reactions from your medicines to your outpatient provider promptly. Patient is instructed and cautioned to not engage in alcohol and or illegal drug use while on prescription medicines. In the event of worsening symptoms, patient is instructed to call the crisis hotline, 911 and or go to the nearest ED for appropriate evaluation and treatment of symptoms. Follow-up with your primary care provider for your other medical issues, concerns and or health care needs.    Total Discharge Time:  Greater than 30 minutes.  Signed: Sanjuana KavaAgnes I Nwoko, PMHNP, FNP-BC 02/08/2014, 2:34 PM Personally evaluated the patient and agree with assessment and plan Madie RenoIrving A. SmithlandLugo, [place].D

## 2014-02-08 NOTE — Progress Notes (Signed)
[facility] Adult Case Management Discharge Plan :  Will you be returning to the same living situation after discharge: No.Pt will stay in [place] with sister until admission into [facility].  At discharge, do you have transportation home?:Yes,  family member Do you have the ability to pay for your medications:Yes,  [place]-recently swtiched from [place].  Release of information consent forms completed and submitted to Medical Records by CSW.  Patient to Follow up at: Follow-up Information   Follow up with [facility]. (referral made 4/10. Call daily to check bed availability. Melissa [phone]. )    Contact information:   [address]  [place], [place] [address] Phone: [phone] Fax: [phone]      [facility] On 02/19/2014. (Appt at 11:30AM. If you go into treatment at [facility], please call to reschedule this appt. )    Contact information:   [address] [place], [place] [address] Phone: [phone] Fax: [phone]      Follow up with [facility]. (Arrive between 8am-9am Monday through Friday for hospital followup/medication management/assessment for therapy services. )    Contact information:   [address] [place], [place] [address] Phone: [phone] Fax: [phone]      Patient denies SI/HI:   Yes,  during group/self report.     Safety Planning and Suicide Prevention discussed:  Yes,  Contact attempts made with pt's sister. SPE completed with pt and he was provided with SPI pamphlet, encouraged to share information with support network, ask questions, and talk about any concerns relating to SPE.  Deren Degrazia Smart LCSWA  02/08/2014, 11:23 AM

## 2014-02-08 NOTE — [facility] Group Notes (Signed)
[facility] Group Notes:  (Nursing/MHT/Case Management/Adjunct)  Date:  02/08/2014  Time:  9:49 AM  Type of Therapy:  Psychoeducational Skills  Participation Level:  Active  Participation Quality:  Appropriate  Affect:  Appropriate  Cognitive:  Appropriate  Insight:  Appropriate  Engagement in Group:  Engaged  Modes of Intervention:  Clarification and Problem-solving  Summary of Progress/Problems: Morning Wellness; Participated well  Helyn NumbersJennifer Hundley Skai Lickteig 02/08/2014, 9:49 AM

## 2014-02-08 NOTE — Clinical Social Work Note (Signed)
CSW contacted [facility]. Still not showing in system where [place]icaid [place]. Pt will d/c home [facility] [address]) and plans [facility]t and call [facility] daily until Medicaid issue resolved and he can be admitted. CSW provided pt with Comprhensive AA list for both [place] and [place]. Pt also provided with info to [facility] for GED program per his request.   [facility] Smart, LCSWA 02/08/2014 11:17 AM

## 2014-02-08 NOTE — Progress Notes (Signed)
Nurs Dischg Note:  D:Patient denies SI/HI/AVH at this time. Pt appears calm and cooperative, and no distress noted.Pt says he wants to go to college and find a good job. Pt given information about [employer] that was left by the Caseworker .  A: All Personal items in locker returned to pt. Pt escorted out of the building.  R:  Pt States he will comply with outpatient services, and take MEDS as prescribed.

## 2014-02-13 NOTE — Progress Notes (Signed)
Patient Discharge Instructions:  After Visit Summary (AVS):   Faxed to:  02/13/14 Discharge Summary Note:   Faxed to:  02/13/14 Psychiatric Admission Assessment Note:   Faxed to:  02/13/14 Suicide Risk Assessment - Discharge Assessment:   Faxed to:  02/13/14 Faxed/Sent to the Next Level Care provider:  02/13/14 Next Level Care Provider Has Access to the EMR, 02/13/14 Faxed to [facility] @ [phone] Faxed to [facility] @ [phone] Records provided to [facility] via CHL/Epic access.  Jerelene ReddenSheena E [place], 02/13/2014, 2:27 PM

## 2014-02-14 ENCOUNTER — Inpatient Hospital Stay: Payer: Self-pay | Admitting: Internal Medicine

## 2014-03-05 ENCOUNTER — Encounter: Payer: Self-pay | Admitting: Internal Medicine

## 2014-03-05 ENCOUNTER — Ambulatory Visit: Payer: Medicaid Other | Attending: Internal Medicine | Admitting: Internal Medicine

## 2014-03-05 VITALS — BP 128/81 | HR 81 | Temp 98.1°F | Resp 16 | Ht 72.0 in | Wt 210.0 lb

## 2014-03-05 DIAGNOSIS — Z4789 Encounter for other orthopedic aftercare: Secondary | ICD-10-CM | POA: Insufficient documentation

## 2014-03-05 DIAGNOSIS — R5381 Other malaise: Secondary | ICD-10-CM

## 2014-03-05 DIAGNOSIS — Z9710 Presence of artificial limb (complete) (partial), unspecified: Secondary | ICD-10-CM

## 2014-03-05 DIAGNOSIS — R531 Weakness: Secondary | ICD-10-CM

## 2014-03-05 DIAGNOSIS — F17200 Nicotine dependence, unspecified, uncomplicated: Secondary | ICD-10-CM | POA: Insufficient documentation

## 2014-03-05 DIAGNOSIS — R5383 Other fatigue: Secondary | ICD-10-CM

## 2014-03-05 NOTE — Progress Notes (Signed)
HFU- 02/02/14 @ [facility] for depression and alcohol abuse Pt states he needs referral for left [facility] Not taking any medications at this time

## 2014-03-05 NOTE — Patient Instructions (Signed)

## 2014-03-05 NOTE — Progress Notes (Signed)
Patient ID: [name], male   DOB: 05/04/1981, 33 y.o.   MRN: [id number]  CC: prosthetic leg  HPI: 33 year old male with past medical history of leg prosthesis who presented to clinic needing referral to orthopedic surgery for followup. Patient [facility]. Patient also requires pain management per pain management clinic. At this time no other complaints.  No Known Allergies Past Medical History  Diagnosis Date  . Eye problem 02/01/2014    Foreign object in left eye, about 2 months ago.   Current Outpatient Prescriptions on File Prior to Visit  Medication Sig Dispense Refill  . hydrOXYzine (ATARAX/VISTARIL) 25 MG tablet Take 1 tablet (25 mg) twice daily as needed for anxiety/tension  60 tablet  0   No current facility-administered medications on file prior to visit.   Family medical history significant for HTN.  History   Social History  . Marital Status: Single    Spouse Name: N/A    Number of Children: N/A  . Years of Education: N/A   Occupational History  . Not on file.   Social History Main Topics  . Smoking status: Current Every Day Smoker -- 0.50 packs/day    Types: Cigarettes  . Smokeless tobacco: Not on file  . Alcohol Use: Yes     Comment: 6pk or more daily  . Drug Use: Yes    Special: Marijuana  . Sexual Activity: No   Other Topics Concern  . Not on file   Social History Narrative  . No narrative on file    Review of Systems  Constitutional: Negative for fever, chills, diaphoresis, activity change, appetite change and fatigue.  HENT: Negative for ear pain, nosebleeds, congestion, facial swelling, rhinorrhea, neck pain, neck stiffness and ear discharge.   Eyes: Negative for pain, discharge, redness, itching and visual disturbance.  Respiratory: Negative for cough, choking, chest tightness, shortness of breath, wheezing and stridor.   Cardiovascular: Negative for chest pain, palpitations and leg swelling.   Gastrointestinal: Negative for abdominal distention.  Genitourinary: Negative for dysuria, urgency, frequency, hematuria, flank pain, decreased urine volume, difficulty urinating and dyspareunia.  Musculoskeletal: Negative for back pain, joint swelling, arthralgias and gait problem.  Neurological: Negative for dizziness, tremors, seizures, syncope, facial asymmetry, speech difficulty, weakness, light-headedness, numbness and headaches.  Hematological: Negative for adenopathy. Does not bruise/bleed easily.  Psychiatric/Behavioral: Negative for hallucinations, behavioral problems, confusion, dysphoric mood, decreased concentration and agitation.    Objective:   Filed Vitals:   03/05/14 1144  BP: 128/81  Pulse: 81  Temp: 98.1 F (36.7 C)  Resp: 16    Physical Exam  Constitutional: Appears well-developed and well-nourished. No distress.  HENT: Normocephalic. External right and left ear normal. Oropharynx is clear and moist.  Eyes: Conjunctivae and EOM are normal. PERRLA, no scleral icterus.  Neck: Normal ROM. Neck supple. No JVD. No tracheal deviation. No thyromegaly.  CVS: RRR, S1/S2 +, no murmurs, no gallops, no carotid bruit.  Pulmonary: Effort and breath sounds normal, no stridor, rhonchi, wheezes, rales.  Abdominal: Soft. BS +,  no distension, tenderness, rebound or guarding.  Musculoskeletal: Normal range of motion. No edema and no tenderness.  Lymphadenopathy: No lymphadenopathy noted, cervical, inguinal. Neuro: Alert. Normal reflexes, muscle tone coordination. No cranial nerve deficit. Skin: Skin is warm and dry. No rash noted. Not diaphoretic. No erythema. No pallor.  Psychiatric: Normal mood and affect. Behavior, judgment, thought content normal.   Lab Results  Component Value Date   WBC 7.2 02/01/2014  HGB 13.8 02/01/2014   HCT 40.7 02/01/2014   MCV 91.9 02/01/2014   PLT 206 02/01/2014   Lab Results  Component Value Date   CREATININE 1.58* 02/02/2014   BUN 21 02/02/2014    NA 136* 02/02/2014   K 4.7 02/02/2014   CL 98 02/02/2014   CO2 28 02/02/2014    No results found for this basename: HGBA1C   Lipid Panel  No results found for this basename: chol, trig, hdl, cholhdl, vldl, ldlcalc       Assessment and plan:   Patient Active Problem List   Diagnosis Date Noted  . Employs prosthetic leg 03/05/2014    Priority: Medium -Referral to pain management provided  - Referral to orthopedic surgery at this Medical Center provided.
